# Patient Record
Sex: Female | Born: 1950 | Race: White | Hispanic: No | Marital: Married | Smoking: Former smoker
Health system: Southern US, Community
[De-identification: ages and names within clinical notes are randomized; demographics above are authoritative.]

## PROBLEM LIST (undated history)

## (undated) DIAGNOSIS — T8859XA Other complications of anesthesia, initial encounter: Secondary | ICD-10-CM

## (undated) DIAGNOSIS — J449 Chronic obstructive pulmonary disease, unspecified: Secondary | ICD-10-CM

## (undated) DIAGNOSIS — J189 Pneumonia, unspecified organism: Secondary | ICD-10-CM

## (undated) DIAGNOSIS — R0600 Dyspnea, unspecified: Secondary | ICD-10-CM

## (undated) HISTORY — PX: DILATION AND CURETTAGE OF UTERUS: SHX78

## (undated) HISTORY — DX: Chronic obstructive pulmonary disease, unspecified: J44.9

## (undated) HISTORY — PX: EYE SURGERY: SHX253

## (undated) HISTORY — PX: APPENDECTOMY: SHX54

## (undated) HISTORY — PX: ABDOMINAL HYSTERECTOMY: SHX81

## (undated) HISTORY — PX: CHOLECYSTECTOMY: SHX55

## (undated) HISTORY — PX: TONSILLECTOMY: SUR1361

## (undated) HISTORY — PX: MENISCUS REPAIR: SHX5179

---

## 2016-09-01 DIAGNOSIS — M549 Dorsalgia, unspecified: Secondary | ICD-10-CM | POA: Diagnosis not present

## 2016-09-01 DIAGNOSIS — M5410 Radiculopathy, site unspecified: Secondary | ICD-10-CM | POA: Diagnosis not present

## 2016-12-04 DIAGNOSIS — M7671 Peroneal tendinitis, right leg: Secondary | ICD-10-CM | POA: Diagnosis not present

## 2016-12-25 DIAGNOSIS — M7671 Peroneal tendinitis, right leg: Secondary | ICD-10-CM | POA: Diagnosis not present

## 2017-01-09 DIAGNOSIS — M7671 Peroneal tendinitis, right leg: Secondary | ICD-10-CM | POA: Diagnosis not present

## 2017-02-08 DIAGNOSIS — M7671 Peroneal tendinitis, right leg: Secondary | ICD-10-CM | POA: Diagnosis not present

## 2017-02-08 DIAGNOSIS — M7661 Achilles tendinitis, right leg: Secondary | ICD-10-CM | POA: Diagnosis not present

## 2017-02-20 DIAGNOSIS — M79671 Pain in right foot: Secondary | ICD-10-CM | POA: Diagnosis not present

## 2017-02-20 DIAGNOSIS — M25571 Pain in right ankle and joints of right foot: Secondary | ICD-10-CM | POA: Diagnosis not present

## 2017-02-20 DIAGNOSIS — M7661 Achilles tendinitis, right leg: Secondary | ICD-10-CM | POA: Diagnosis not present

## 2017-03-01 DIAGNOSIS — M7671 Peroneal tendinitis, right leg: Secondary | ICD-10-CM | POA: Diagnosis not present

## 2017-05-10 DIAGNOSIS — M7671 Peroneal tendinitis, right leg: Secondary | ICD-10-CM | POA: Diagnosis not present

## 2017-05-10 DIAGNOSIS — H25813 Combined forms of age-related cataract, bilateral: Secondary | ICD-10-CM | POA: Diagnosis not present

## 2017-06-05 DIAGNOSIS — Z01818 Encounter for other preprocedural examination: Secondary | ICD-10-CM | POA: Diagnosis not present

## 2017-06-05 DIAGNOSIS — H25811 Combined forms of age-related cataract, right eye: Secondary | ICD-10-CM | POA: Diagnosis not present

## 2017-06-05 DIAGNOSIS — H2511 Age-related nuclear cataract, right eye: Secondary | ICD-10-CM | POA: Diagnosis not present

## 2017-06-05 DIAGNOSIS — H25813 Combined forms of age-related cataract, bilateral: Secondary | ICD-10-CM | POA: Diagnosis not present

## 2017-07-09 DIAGNOSIS — H2512 Age-related nuclear cataract, left eye: Secondary | ICD-10-CM | POA: Diagnosis not present

## 2017-07-09 DIAGNOSIS — H25812 Combined forms of age-related cataract, left eye: Secondary | ICD-10-CM | POA: Diagnosis not present

## 2017-08-02 DIAGNOSIS — Z961 Presence of intraocular lens: Secondary | ICD-10-CM | POA: Diagnosis not present

## 2017-09-07 DIAGNOSIS — J111 Influenza due to unidentified influenza virus with other respiratory manifestations: Secondary | ICD-10-CM | POA: Diagnosis not present

## 2017-09-07 DIAGNOSIS — J0140 Acute pansinusitis, unspecified: Secondary | ICD-10-CM | POA: Diagnosis not present

## 2017-10-07 DIAGNOSIS — R1084 Generalized abdominal pain: Secondary | ICD-10-CM | POA: Diagnosis not present

## 2017-10-07 DIAGNOSIS — K5900 Constipation, unspecified: Secondary | ICD-10-CM | POA: Diagnosis not present

## 2017-10-29 DIAGNOSIS — K5900 Constipation, unspecified: Secondary | ICD-10-CM | POA: Diagnosis not present

## 2018-03-06 DIAGNOSIS — G894 Chronic pain syndrome: Secondary | ICD-10-CM | POA: Diagnosis not present

## 2018-03-06 DIAGNOSIS — M5126 Other intervertebral disc displacement, lumbar region: Secondary | ICD-10-CM | POA: Diagnosis not present

## 2018-04-30 DIAGNOSIS — R030 Elevated blood-pressure reading, without diagnosis of hypertension: Secondary | ICD-10-CM | POA: Diagnosis not present

## 2018-04-30 DIAGNOSIS — M461 Sacroiliitis, not elsewhere classified: Secondary | ICD-10-CM | POA: Diagnosis not present

## 2018-04-30 DIAGNOSIS — M48061 Spinal stenosis, lumbar region without neurogenic claudication: Secondary | ICD-10-CM | POA: Diagnosis not present

## 2018-04-30 DIAGNOSIS — Z6835 Body mass index (BMI) 35.0-35.9, adult: Secondary | ICD-10-CM | POA: Diagnosis not present

## 2018-04-30 DIAGNOSIS — M47896 Other spondylosis, lumbar region: Secondary | ICD-10-CM | POA: Diagnosis not present

## 2018-05-10 DIAGNOSIS — M47818 Spondylosis without myelopathy or radiculopathy, sacral and sacrococcygeal region: Secondary | ICD-10-CM | POA: Diagnosis not present

## 2018-05-28 DIAGNOSIS — Z23 Encounter for immunization: Secondary | ICD-10-CM | POA: Diagnosis not present

## 2018-05-28 DIAGNOSIS — G894 Chronic pain syndrome: Secondary | ICD-10-CM | POA: Diagnosis not present

## 2018-05-28 DIAGNOSIS — M8580 Other specified disorders of bone density and structure, unspecified site: Secondary | ICD-10-CM | POA: Diagnosis not present

## 2018-05-28 DIAGNOSIS — Z1389 Encounter for screening for other disorder: Secondary | ICD-10-CM | POA: Diagnosis not present

## 2018-05-28 DIAGNOSIS — Z87891 Personal history of nicotine dependence: Secondary | ICD-10-CM | POA: Diagnosis not present

## 2018-05-28 DIAGNOSIS — I10 Essential (primary) hypertension: Secondary | ICD-10-CM | POA: Diagnosis not present

## 2018-05-28 DIAGNOSIS — Z0000 Encounter for general adult medical examination without abnormal findings: Secondary | ICD-10-CM | POA: Diagnosis not present

## 2018-06-10 DIAGNOSIS — E0590 Thyrotoxicosis, unspecified without thyrotoxic crisis or storm: Secondary | ICD-10-CM | POA: Diagnosis not present

## 2018-07-08 DIAGNOSIS — M48061 Spinal stenosis, lumbar region without neurogenic claudication: Secondary | ICD-10-CM | POA: Diagnosis not present

## 2018-07-08 DIAGNOSIS — M461 Sacroiliitis, not elsewhere classified: Secondary | ICD-10-CM | POA: Diagnosis not present

## 2018-07-08 DIAGNOSIS — M47818 Spondylosis without myelopathy or radiculopathy, sacral and sacrococcygeal region: Secondary | ICD-10-CM | POA: Diagnosis not present

## 2018-07-08 DIAGNOSIS — M47896 Other spondylosis, lumbar region: Secondary | ICD-10-CM | POA: Diagnosis not present

## 2018-07-16 DIAGNOSIS — M47816 Spondylosis without myelopathy or radiculopathy, lumbar region: Secondary | ICD-10-CM | POA: Diagnosis not present

## 2018-08-06 DIAGNOSIS — M47816 Spondylosis without myelopathy or radiculopathy, lumbar region: Secondary | ICD-10-CM | POA: Diagnosis not present

## 2018-08-06 DIAGNOSIS — R030 Elevated blood-pressure reading, without diagnosis of hypertension: Secondary | ICD-10-CM | POA: Diagnosis not present

## 2018-08-09 DIAGNOSIS — E039 Hypothyroidism, unspecified: Secondary | ICD-10-CM | POA: Diagnosis not present

## 2018-09-03 DIAGNOSIS — M461 Sacroiliitis, not elsewhere classified: Secondary | ICD-10-CM | POA: Diagnosis not present

## 2018-09-03 DIAGNOSIS — R030 Elevated blood-pressure reading, without diagnosis of hypertension: Secondary | ICD-10-CM | POA: Diagnosis not present

## 2018-09-10 DIAGNOSIS — M47818 Spondylosis without myelopathy or radiculopathy, sacral and sacrococcygeal region: Secondary | ICD-10-CM | POA: Diagnosis not present

## 2018-09-10 DIAGNOSIS — R030 Elevated blood-pressure reading, without diagnosis of hypertension: Secondary | ICD-10-CM | POA: Diagnosis not present

## 2018-09-25 DIAGNOSIS — E039 Hypothyroidism, unspecified: Secondary | ICD-10-CM | POA: Diagnosis not present

## 2018-12-26 DIAGNOSIS — E039 Hypothyroidism, unspecified: Secondary | ICD-10-CM | POA: Diagnosis not present

## 2019-05-30 DIAGNOSIS — M47816 Spondylosis without myelopathy or radiculopathy, lumbar region: Secondary | ICD-10-CM | POA: Diagnosis not present

## 2019-05-30 DIAGNOSIS — M461 Sacroiliitis, not elsewhere classified: Secondary | ICD-10-CM | POA: Diagnosis not present

## 2019-05-30 DIAGNOSIS — M47818 Spondylosis without myelopathy or radiculopathy, sacral and sacrococcygeal region: Secondary | ICD-10-CM | POA: Diagnosis not present

## 2019-05-30 DIAGNOSIS — M48061 Spinal stenosis, lumbar region without neurogenic claudication: Secondary | ICD-10-CM | POA: Diagnosis not present

## 2019-06-04 DIAGNOSIS — M48061 Spinal stenosis, lumbar region without neurogenic claudication: Secondary | ICD-10-CM | POA: Diagnosis not present

## 2019-06-04 DIAGNOSIS — R030 Elevated blood-pressure reading, without diagnosis of hypertension: Secondary | ICD-10-CM | POA: Diagnosis not present

## 2019-06-13 DIAGNOSIS — R030 Elevated blood-pressure reading, without diagnosis of hypertension: Secondary | ICD-10-CM | POA: Diagnosis not present

## 2019-06-13 DIAGNOSIS — M461 Sacroiliitis, not elsewhere classified: Secondary | ICD-10-CM | POA: Diagnosis not present

## 2019-07-02 DIAGNOSIS — M47816 Spondylosis without myelopathy or radiculopathy, lumbar region: Secondary | ICD-10-CM | POA: Diagnosis not present

## 2019-07-02 DIAGNOSIS — R030 Elevated blood-pressure reading, without diagnosis of hypertension: Secondary | ICD-10-CM | POA: Diagnosis not present

## 2019-07-09 DIAGNOSIS — M47816 Spondylosis without myelopathy or radiculopathy, lumbar region: Secondary | ICD-10-CM | POA: Diagnosis not present

## 2019-07-09 DIAGNOSIS — R030 Elevated blood-pressure reading, without diagnosis of hypertension: Secondary | ICD-10-CM | POA: Diagnosis not present

## 2019-08-05 DIAGNOSIS — M47818 Spondylosis without myelopathy or radiculopathy, sacral and sacrococcygeal region: Secondary | ICD-10-CM | POA: Diagnosis not present

## 2019-08-11 DIAGNOSIS — M48061 Spinal stenosis, lumbar region without neurogenic claudication: Secondary | ICD-10-CM | POA: Diagnosis not present

## 2019-08-20 DIAGNOSIS — M5127 Other intervertebral disc displacement, lumbosacral region: Secondary | ICD-10-CM | POA: Diagnosis not present

## 2019-08-20 DIAGNOSIS — M48061 Spinal stenosis, lumbar region without neurogenic claudication: Secondary | ICD-10-CM | POA: Diagnosis not present

## 2019-09-22 DIAGNOSIS — S82831D Other fracture of upper and lower end of right fibula, subsequent encounter for closed fracture with routine healing: Secondary | ICD-10-CM | POA: Diagnosis not present

## 2019-09-29 DIAGNOSIS — S82831D Other fracture of upper and lower end of right fibula, subsequent encounter for closed fracture with routine healing: Secondary | ICD-10-CM | POA: Diagnosis not present

## 2019-10-08 DIAGNOSIS — S82831D Other fracture of upper and lower end of right fibula, subsequent encounter for closed fracture with routine healing: Secondary | ICD-10-CM | POA: Diagnosis not present

## 2019-10-27 DIAGNOSIS — S82831D Other fracture of upper and lower end of right fibula, subsequent encounter for closed fracture with routine healing: Secondary | ICD-10-CM | POA: Diagnosis not present

## 2019-11-19 DIAGNOSIS — M25671 Stiffness of right ankle, not elsewhere classified: Secondary | ICD-10-CM | POA: Diagnosis not present

## 2019-11-19 DIAGNOSIS — M25571 Pain in right ankle and joints of right foot: Secondary | ICD-10-CM | POA: Diagnosis not present

## 2019-11-19 DIAGNOSIS — R2689 Other abnormalities of gait and mobility: Secondary | ICD-10-CM | POA: Diagnosis not present

## 2019-11-24 DIAGNOSIS — S82831D Other fracture of upper and lower end of right fibula, subsequent encounter for closed fracture with routine healing: Secondary | ICD-10-CM | POA: Diagnosis not present

## 2020-03-09 DIAGNOSIS — M48061 Spinal stenosis, lumbar region without neurogenic claudication: Secondary | ICD-10-CM | POA: Diagnosis not present

## 2020-04-13 DIAGNOSIS — M48061 Spinal stenosis, lumbar region without neurogenic claudication: Secondary | ICD-10-CM | POA: Diagnosis not present

## 2020-09-03 DIAGNOSIS — J209 Acute bronchitis, unspecified: Secondary | ICD-10-CM | POA: Diagnosis not present

## 2020-09-03 DIAGNOSIS — R030 Elevated blood-pressure reading, without diagnosis of hypertension: Secondary | ICD-10-CM | POA: Diagnosis not present

## 2020-09-14 DIAGNOSIS — E039 Hypothyroidism, unspecified: Secondary | ICD-10-CM | POA: Diagnosis not present

## 2020-09-14 DIAGNOSIS — J40 Bronchitis, not specified as acute or chronic: Secondary | ICD-10-CM | POA: Diagnosis not present

## 2020-09-16 DIAGNOSIS — M545 Low back pain, unspecified: Secondary | ICD-10-CM | POA: Diagnosis not present

## 2020-09-16 DIAGNOSIS — M533 Sacrococcygeal disorders, not elsewhere classified: Secondary | ICD-10-CM | POA: Diagnosis not present

## 2020-10-28 DIAGNOSIS — M545 Low back pain, unspecified: Secondary | ICD-10-CM | POA: Diagnosis not present

## 2020-11-01 DIAGNOSIS — E039 Hypothyroidism, unspecified: Secondary | ICD-10-CM | POA: Diagnosis not present

## 2020-11-29 DIAGNOSIS — M47817 Spondylosis without myelopathy or radiculopathy, lumbosacral region: Secondary | ICD-10-CM | POA: Diagnosis not present

## 2020-11-29 DIAGNOSIS — M545 Low back pain, unspecified: Secondary | ICD-10-CM | POA: Diagnosis not present

## 2020-11-29 DIAGNOSIS — M48061 Spinal stenosis, lumbar region without neurogenic claudication: Secondary | ICD-10-CM | POA: Diagnosis not present

## 2020-11-29 DIAGNOSIS — M5137 Other intervertebral disc degeneration, lumbosacral region: Secondary | ICD-10-CM | POA: Diagnosis not present

## 2020-12-24 DIAGNOSIS — M5441 Lumbago with sciatica, right side: Secondary | ICD-10-CM | POA: Diagnosis not present

## 2020-12-24 DIAGNOSIS — M5416 Radiculopathy, lumbar region: Secondary | ICD-10-CM | POA: Diagnosis not present

## 2021-01-31 DIAGNOSIS — M5459 Other low back pain: Secondary | ICD-10-CM | POA: Diagnosis not present

## 2021-02-02 DIAGNOSIS — M545 Low back pain, unspecified: Secondary | ICD-10-CM | POA: Diagnosis not present

## 2021-02-08 DIAGNOSIS — M545 Low back pain, unspecified: Secondary | ICD-10-CM | POA: Diagnosis not present

## 2021-02-10 DIAGNOSIS — M545 Low back pain, unspecified: Secondary | ICD-10-CM | POA: Diagnosis not present

## 2021-02-15 DIAGNOSIS — M545 Low back pain, unspecified: Secondary | ICD-10-CM | POA: Diagnosis not present

## 2021-02-22 DIAGNOSIS — M545 Low back pain, unspecified: Secondary | ICD-10-CM | POA: Diagnosis not present

## 2021-02-24 DIAGNOSIS — M545 Low back pain, unspecified: Secondary | ICD-10-CM | POA: Diagnosis not present

## 2021-02-24 DIAGNOSIS — M5459 Other low back pain: Secondary | ICD-10-CM | POA: Diagnosis not present

## 2021-02-28 DIAGNOSIS — M545 Low back pain, unspecified: Secondary | ICD-10-CM | POA: Diagnosis not present

## 2021-03-01 DIAGNOSIS — M545 Low back pain, unspecified: Secondary | ICD-10-CM | POA: Diagnosis not present

## 2021-03-08 DIAGNOSIS — M48062 Spinal stenosis, lumbar region with neurogenic claudication: Secondary | ICD-10-CM | POA: Diagnosis not present

## 2021-03-16 ENCOUNTER — Ambulatory Visit: Payer: Self-pay | Admitting: Orthopedic Surgery

## 2021-03-30 DIAGNOSIS — J069 Acute upper respiratory infection, unspecified: Secondary | ICD-10-CM | POA: Diagnosis not present

## 2021-03-31 DIAGNOSIS — R110 Nausea: Secondary | ICD-10-CM | POA: Diagnosis not present

## 2021-03-31 DIAGNOSIS — Z5321 Procedure and treatment not carried out due to patient leaving prior to being seen by health care provider: Secondary | ICD-10-CM | POA: Diagnosis not present

## 2021-03-31 DIAGNOSIS — R509 Fever, unspecified: Secondary | ICD-10-CM | POA: Diagnosis not present

## 2021-04-01 DIAGNOSIS — R52 Pain, unspecified: Secondary | ICD-10-CM | POA: Diagnosis not present

## 2021-04-01 DIAGNOSIS — Z743 Need for continuous supervision: Secondary | ICD-10-CM | POA: Diagnosis not present

## 2021-04-01 DIAGNOSIS — R0902 Hypoxemia: Secondary | ICD-10-CM | POA: Diagnosis not present

## 2021-04-01 DIAGNOSIS — R42 Dizziness and giddiness: Secondary | ICD-10-CM | POA: Diagnosis not present

## 2021-04-01 DIAGNOSIS — S0990XA Unspecified injury of head, initial encounter: Secondary | ICD-10-CM | POA: Diagnosis not present

## 2021-04-01 DIAGNOSIS — R059 Cough, unspecified: Secondary | ICD-10-CM | POA: Diagnosis not present

## 2021-04-01 DIAGNOSIS — M542 Cervicalgia: Secondary | ICD-10-CM | POA: Diagnosis not present

## 2021-04-01 DIAGNOSIS — G4489 Other headache syndrome: Secondary | ICD-10-CM | POA: Diagnosis not present

## 2021-04-01 DIAGNOSIS — R519 Headache, unspecified: Secondary | ICD-10-CM | POA: Diagnosis not present

## 2021-04-01 DIAGNOSIS — G44311 Acute post-traumatic headache, intractable: Secondary | ICD-10-CM | POA: Diagnosis not present

## 2021-04-04 DIAGNOSIS — G43019 Migraine without aura, intractable, without status migrainosus: Secondary | ICD-10-CM | POA: Diagnosis not present

## 2021-04-05 ENCOUNTER — Ambulatory Visit: Payer: Self-pay | Admitting: Orthopedic Surgery

## 2021-04-08 NOTE — Pre-Procedure Instructions (Signed)
Surgical Instructions    Your procedure is scheduled on Wednesday, August 31st, 2022.  Report to [facility] Main Entrance "A" at 11:00 A.M., then check in with the Admitting office.  Call this number if you have problems the morning of surgery:  [phone]   If you have any questions prior to your surgery date call [phone]: Open Monday-Friday 8am-4pm    Remember:  Do not eat after midnight the night before your surgery  You may drink clear liquids until 10:00 A.M. the morning of your surgery.   Clear liquids allowed are: Water, Non-Citrus Juices (without pulp), Carbonated Beverages, Clear Tea, Black Coffee Only, and Gatorade    Take these medicines the morning of surgery with A SIP OF WATER: None    As of today, STOP taking any Aspirin (unless otherwise instructed by your surgeon) Aleve, Naproxen, Ibuprofen, Motrin, Advil, Goody's, BC's, all herbal medications, fish oil, and all vitamins.          Do not wear jewelry or makeup Do not wear lotions, powders, perfumes, or deodorant. Do not shave 48 hours prior to surgery.  Do not bring valuables to the hospital. DO Not wear nail polish, gel polish, artificial nails, or any other type of covering on  natural nails including finger and toenails. If patients have artificial nails, gel coating, etc. that need to be removed by a nail salon please have this removed prior to surgery or surgery may need to be canceled/delayed if the surgeon/ anesthesia feels like the patient is unable to be adequately monitored.             [facility] is not responsible for any belongings or valuables.  Do NOT Smoke (Tobacco/Vaping) or drink Alcohol 24 hours prior to your procedure If you use a CPAP at night, you may bring all equipment for your overnight stay.   Contacts, glasses, dentures or bridgework may not be worn into surgery, please bring cases for these belongings   For patients admitted to the hospital, discharge time will be determined by  your treatment team.   Patients discharged the day of surgery will not be allowed to drive home, and someone needs to stay with them for 24 hours.  ONLY 1 SUPPORT PERSON MAY BE PRESENT WHILE YOU ARE IN SURGERY. IF YOU ARE TO BE ADMITTED ONCE YOU ARE IN YOUR ROOM YOU WILL BE ALLOWED TWO (2) VISITORS.  Minor children may have two parents present. Special consideration for safety and communication needs will be reviewed on a case by case basis.  Special instructions:    Oral Hygiene is also important to reduce your risk of infection.  Remember - BRUSH YOUR TEETH THE MORNING OF SURGERY WITH YOUR REGULAR TOOTHPASTE   [facility]- Preparing For Surgery  Before surgery, you can play an important role. Because skin is not sterile, your skin needs to be as free of germs as possible. You can reduce the number of germs on your skin by washing with CHG (chlorahexidine gluconate) Soap before surgery.  CHG is an antiseptic cleaner which kills germs and bonds with the skin to continue killing germs even after washing.     Please do not use if you have an allergy to CHG or antibacterial soaps. If your skin becomes reddened/irritated stop using the CHG.  Do not shave (including legs and underarms) for at least 48 hours prior to first CHG shower. It is OK to shave your face.  Please follow these instructions carefully.  Shower the NIGHT BEFORE SURGERY and the MORNING OF SURGERY with CHG Soap.   If you chose to wash your hair, wash your hair first as usual with your normal shampoo. After you shampoo, rinse your hair and body thoroughly to remove the shampoo.  Then [employer] and genitals (private parts) with your normal soap and rinse thoroughly to remove soap.  After that Use CHG Soap as you would any other liquid soap. You can apply CHG directly to the skin and wash gently with a scrungie or a clean washcloth.   Apply the CHG Soap to your body ONLY FROM THE NECK DOWN.  Do not use on open wounds or open  sores. Avoid contact with your eyes, ears, mouth and genitals (private parts). Wash Face and genitals (private parts)  with your normal soap.   Wash thoroughly, paying special attention to the area where your surgery will be performed.  Thoroughly rinse your body with warm water from the neck down.  DO NOT shower/wash with your normal soap after using and rinsing off the CHG Soap.  Pat yourself dry with a CLEAN TOWEL.  Wear CLEAN PAJAMAS to bed the night before surgery  Place CLEAN SHEETS on your bed the night before your surgery  DO NOT SLEEP WITH PETS.   Day of Surgery:  Take a shower with CHG soap. Wear Clean/Comfortable clothing the morning of surgery Do not apply any deodorants/lotions.   Remember to brush your teeth WITH YOUR REGULAR TOOTHPASTE.   Please read over the following fact sheets that you were given.

## 2021-04-11 ENCOUNTER — Ambulatory Visit: Payer: Self-pay | Admitting: Orthopedic Surgery

## 2021-04-11 ENCOUNTER — Encounter (HOSPITAL_COMMUNITY)
Admission: RE | Admit: 2021-04-11 | Discharge: 2021-04-11 | Disposition: A | Discharge status: Home / Self Care | Payer: Medicare Other | Source: Ambulatory Visit | Attending: Orthopedic Surgery | Admitting: Orthopedic Surgery

## 2021-04-11 ENCOUNTER — Encounter (HOSPITAL_COMMUNITY): Payer: Self-pay | Admitting: Physician Assistant

## 2021-04-11 ENCOUNTER — Encounter: Payer: Self-pay | Admitting: Orthopedic Surgery

## 2021-04-11 ENCOUNTER — Encounter (HOSPITAL_COMMUNITY): Payer: Self-pay

## 2021-04-11 ENCOUNTER — Other Ambulatory Visit: Payer: Self-pay

## 2021-04-11 DIAGNOSIS — M5136 Other intervertebral disc degeneration, lumbar region: Secondary | ICD-10-CM | POA: Diagnosis not present

## 2021-04-11 DIAGNOSIS — Z87891 Personal history of nicotine dependence: Secondary | ICD-10-CM | POA: Insufficient documentation

## 2021-04-11 DIAGNOSIS — Z01812 Encounter for preprocedural laboratory examination: Secondary | ICD-10-CM | POA: Insufficient documentation

## 2021-04-11 DIAGNOSIS — M48062 Spinal stenosis, lumbar region with neurogenic claudication: Secondary | ICD-10-CM | POA: Diagnosis not present

## 2021-04-11 DIAGNOSIS — M545 Low back pain, unspecified: Secondary | ICD-10-CM | POA: Diagnosis not present

## 2021-04-11 DIAGNOSIS — Z01818 Encounter for other preprocedural examination: Secondary | ICD-10-CM | POA: Diagnosis not present

## 2021-04-11 HISTORY — DX: Other complications of anesthesia, initial encounter: T88.59XA

## 2021-04-11 HISTORY — DX: Dyspnea, unspecified: R06.00

## 2021-04-11 HISTORY — DX: Pneumonia, unspecified organism: J18.9

## 2021-04-11 LAB — URINALYSIS, ROUTINE W REFLEX MICROSCOPIC
Bilirubin Urine: NEGATIVE
Glucose, UA: NEGATIVE mg/dL
Hgb urine dipstick: NEGATIVE
Ketones, ur: NEGATIVE mg/dL
Leukocytes,Ua: NEGATIVE
Nitrite: NEGATIVE
Protein, ur: NEGATIVE mg/dL
Specific Gravity, Urine: 1.01 (ref 1.005–1.030)
pH: 7 (ref 5.0–8.0)

## 2021-04-11 LAB — BASIC METABOLIC PANEL
Anion gap: 11 (ref 5–15)
BUN: <5 mg/dL — ABNORMAL LOW (ref 8–23)
CO2: 26 mmol/L (ref 22–32)
Calcium: 9.2 mg/dL (ref 8.9–10.3)
Chloride: 102 mmol/L (ref 98–111)
Creatinine, Ser: 0.72 mg/dL (ref 0.44–1.00)
GFR, Estimated: >60 mL/min (ref >60)
Glucose, Bld: 86 mg/dL (ref 70–99)
Potassium: 3.8 mmol/L (ref 3.5–5.1)
Sodium: 139 mmol/L (ref 135–145)

## 2021-04-11 LAB — CBC
HCT: 41.2 % (ref 36.0–46.0)
Hemoglobin: 13.5 g/dL (ref 12.0–15.0)
MCH: 32.5 pg (ref 26.0–34.0)
MCHC: 32.8 g/dL (ref 30.0–36.0)
MCV: 99.3 fL (ref 80.0–100.0)
Platelets: 399 10*3/uL (ref 150–400)
RBC: 4.15 MIL/uL (ref 3.87–5.11)
RDW: 13 % (ref 11.5–15.5)
WBC: 10 10*3/uL (ref 4.0–10.5)
nRBC: 0 % (ref 0.0–0.2)

## 2021-04-11 LAB — SURGICAL PCR SCREEN
MRSA, PCR: NEGATIVE
Staphylococcus aureus: NEGATIVE

## 2021-04-11 LAB — TYPE AND SCREEN
ABO/RH(D): O POS
Antibody Screen: NEGATIVE

## 2021-04-11 LAB — PROTIME-INR
INR: 0.9 (ref 0.8–1.2)
Prothrombin Time: 12.6 seconds (ref 11.4–15.2)

## 2021-04-11 LAB — APTT: aPTT: 21 seconds — ABNORMAL LOW (ref 24–36)

## 2021-04-11 NOTE — Progress Notes (Addendum)
Patient in PAT was concerned about the time of surgery. Per order, patient is scheduled to have surgery on 04/20/2021 between 12:58 and 17:28. Patient verbalized that this AM MD told her that she is his first case (07:30 o'clock). This Clinical research associate called Voncille Lo PA-C and asked for clarification. PA-C said that she will contact MD and she will let the patient know if the schedule was changed.   BP elevated in PAT 170/92 when patient arrived and 151/80 after the appointment. Patient verbalized that she is not hypertensive and she is not taking any medication for high BP. Patient verbalized that she is feeling well, no symptoms. PA-C in PAT notified and Voncille Lo, PA-C in Dr. Shon Baton office notified.

## 2021-04-11 NOTE — Progress Notes (Addendum)
PCP - [name], MD Cardiologist - denies  PPM/ICD - denies Device Orders - N/A Rep Notified - N/A  Chest x-ray - N/A EKG - N/A Stress Test - denies ECHO - denies Cardiac Cath - denies  Sleep Study - denies CPAP - N/A  Fasting Blood Sugar - N/A  Blood Thinner Instructions: N/A  Aspirin Instructions: Patient was instructed: As of today, STOP taking any Aspirin (unless otherwise instructed by your surgeon) Aleve, Naproxen, Ibuprofen, Motrin, Advil, Goody's, BC's, all herbal medications, fish oil, and all vitamins.  ERAS Protcol - yes PRE-SURGERY Ensure or G2- no  COVID TEST- patient was instructed to go to the COVID testing site on 04/18/2021  Anesthesia review: yes. Difficulty waking up after anesthesia, per patient. BP elevated in PAT 170/92 when patient arrived and 151/80 at the end of appointment. Patient with no hx of hypertension. No symptoms in PAT;   Patient denies shortness of breath, fever, cough and chest pain at PAT appointment   All instructions explained to the patient, with a verbal understanding of the material. Patient agrees to go over the instructions while at home for a better understanding. Patient also instructed to self quarantine after being tested for COVID-19. The opportunity to ask questions was provided.

## 2021-04-11 NOTE — H&P (Signed)
Subjective:   Allergies Reviewed Allergies NKDA Medications Reviewed Medications albuterol sulfate 2.5 mg/3 mL (0.083 %) solution for nebulization 04/07/21   filled surescripts azithromycin 250 mg tablet TK 2 TS PO ON DAY 1, THEN TK 1 T PO D FOR 4 DAYS 04/01/21   filled surescripts codeine 10 mg-guaifenesin 100 mg/5 mL oral liquid TAKE 10 MILILITERS BY MOUTH EVERY 6 HOURS AS NEEDED 09/14/20   filled surescripts diclofenac potassium 50 mg tablet TAKE 1 TABLET BY MOUTH TWICE DAILY 02/05/20   filled surescripts gabapentin 300 mg capsule TAKE 1 CAPSULE BY MOUTH AT BEDTIME 12/24/20   filled surescripts levothyroxine 100 mcg tablet TAKE 1 TABLET BY MOUTH DAILY 09/16/20   filled surescripts meloxicam 15 mg tablet TAKE 1 TABLET BY MOUTH DAILY WITH A MEAL 10/28/20   filled surescripts methylPREDNISolone 4 mg tablets in a dose pack FOLLOW PACKAGE DIRECTIONS 11/05/20   filled surescripts ondansetron 4 mg disintegrating tablet DISSOLVE 1 TABLET ON THE TONGUE EVERY 6 HOURS AS NEEDED FOR NAUSEA OR VOMITING 04/01/21   filled surescripts Ubrelvy 100 mg tablet 04/05/21   filled surescripts Problems Reviewed Problems Pain in lumbar spine - Onset: 01/31/2021 Family History Reviewed Family History Social History Reviewed Social History Surgical History Reviewed Surgical History Past Medical History Reviewed Past Medical History  HPI  Here for preop H&P. Recent fall in bathtub, CT scan demonstrated pneumonia. Treated with 5 day course of antibiotics, ended on Thursday. She does feels symptoms have improved. She has seen her primary care provider who does not think this recent episode would require surgery to be postponed. XLIF L3-L4 with PSFI CONE 04/20/21   Review of Systems Pertinent items are noted in HPI.  Objective:    Ht: 5 ft 1 in 04/11/2021 10:45 am Wt: 191.5 lbs 04/11/2021 10:48 am BMI: 36.2 04/11/2021 10:48 am BP: 140/80 04/11/2021 10:47 am Pulse: 95 bpm  04/11/2021 10:47 am  General: Alert and oriented 3, no apparent distress.  Heart: Regular rate and rhythm no rubs, murmurs, or gallops  Lungs: Bilateral crackles at the base of the lungs. Recent pneumonia diagnosis, treated with antibiotics. Able speak in complete sentences. No shortness of breath  Abdomen: Bowel sounds 4, nondistended, nontender, no rebound tenderness  Stacey Poole continues to have significant back buttock and neuropathic leg pain bilaterally. Back pain is quite severe and represents approximately 80% of her overall pain. Difficulty with forward flexion and extension due to pain. 5/5 motor strength in the lower extremities bilaterally. Positive dysesthesias consistent with claudication primarily in the thigh  Lumbar MRI: completed on 02/24/21 was reviewed with the patient. It was completed at [facility] imaging; I have independently reviewed the images as well as the radiology report. No significant canal or foraminal stenosis L1-3. Slight progression of degenerative disc disease at L3-4 with severe canal and lateral recess stenosis. Mild central stenosis at L4-5, no significant foraminal stenosis. No significant canal stenosis at L5-S1. There is also reported a questionable cyst in the kidney. I have given her a copy of the report to take to her primary care physician to review to determine if further workup is necessary.  Assessment:   Stacey Poole is a very pleasant 70 year old man has had chronic debilitating back pain with episodic neuropathic leg pain. 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 We have had a long discussion about treatment options. She has had multiple injections in the past, and physical therapy and despite  this her pain is severe and debilitating and she has expressed a desire to move forward with surgery.   Plan:    At this point time I do believe the spinal stenosis and  degenerative disc disease of her primary sources of pain. We briefly discussed surgical options. If we move forward with a straightforward decompression this would help alleviate the neurogenic claudication pain I decompressing the nerves. However, I do not think it will significantly improve her back pain which is a major component of her disability. The other option is to perform a lateral interbody fusion which would remove the painful disc restore the intervertebral disc height and indirectly decompress the spinal stenosis. This would address the back and neuropathic leg pain.  I discussed the surgical procedure in great detail with the patient and her mother and all their questions were encouraged and addressed. She will think about her options and contact me and let me know how she would like to proceed.OLIF/XLIF risks, benefits of surgery were reviewed with the patient. These include: infection, bleeding, death, stroke, paralysis, ongoing or worse pain, need for additional surgery, injury to the lumbar plexus resulting in hip flexor weakness and difficulty walking without assistive devices. Adjacent segment degenerative disease, need for additional surgery including fusing other levels, leak of spinal fluid, Nonunion, hardware failure, breakage, or mal-position. Deep venous thrombosis (DVT) requiring additional treatment such as filter, and/or medications. Injury to abdominal contents, loss in bowel and bladder control.  We have previously obtained preoperative medical clearance from the patient's primary care provider. Unfortunately, since we obtained clearance the patient did have a fall in a bathtub, she had a CT scan performed due to this fall but also demonstrated pneumonia. She was treated with 5 day course of antibiotics which she finished last Thursday. She does feel that her symptoms have improved. She did have a follow-up with her primary care provider who does not feel that the pneumonia or the  concussion that she suffered from the fall would be a reason that she should not move forward with surgery. We will defer additional clearance in regard to the pneumonia to anesthesia at [facility].  We have also discussed the post-operative recovery period to include: bathing/showering restrictions, wound healing, activity (and driving) restrictions, medications/pain mangement.  We have also discussed post-operative redflags to include: signs and symptoms of postoperative infection, DVT/PE.  Follow-up: 2 weeks postop

## 2021-04-12 NOTE — Progress Notes (Addendum)
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  History includes former smoker, dyspnea, PNA. Reportedly "difficulty waking up after anesthesia."  PAT RN received a call for [place], [place], [facility] asking if anesthesia would require preoperative CXR based on history of recent PNA. Currently, no records regarding her pneumonia are available, but reportedly patient has already had follow-up with her PCP and felt clinically improved and cleared for surgery. Given this information, I do not think she would require a preoperative CXR. That said, timing of PNA could influence timing of surgery, so I will review primary care records/clearances once available. Preliminary H&P note suggests pneumonia was an incidental finding on CT after patient fell in the tub. She has since completed a 5 day course of antibiotics, her symptoms improved, and she had re-evaluation by her PCP "who does not feel that the pneumonia or the concussion that she suffered from the fall would be a reason that she should not move forward with surgery."  Will follow up records.   ADDENDUM 04/13/21 3:46 PM: Received records from [facility] ED and [facility]. She had an ED visit on 04/01/21 for headache (referred by PCP). She had a fever of 102 4 days prior (03/28/21) with decreased appetite. Some coughing. She attempted to take a bath to lower her fever and slipped in the bathtub hitting her head. She had a severe headache since with occasional N/V with emesis x4. She denied LOC.  She reportedly had two negative COVID-19 tests at her PCP office. Toradol gave partial headache relief. T 99.6, O2 sat 92%.Lung sounds documented as clear. Labs showed WBC 11.2, H/H 15.1/44.2, Na 131, K 3.2, Cr 0.80, glucose 123. Imagine of the head and c-spine did not show any acute findings, but did not partially visualized right perihilar airspace consolidation prompting a CXR which showed RUL and RLL opacities felt consistent with PNA. She was discharged home on azithromycin (#10), Zofran as needed. (ED discharge medications also listed Acyclovir x 7 days, Cefdinir 300 mg (#20), Albuterol MDI as needed, Sterapred 10 mg/6 day pack_, Tessalon TID (#20), but unclear if these had been previously prescribed by her PCP.)   She had ED follow-up with her PCP Dr. Nelson Chimes on 04/04/21. BP 124/72. He had previously cleared her for surgery following 03/08/21 visit, but had follow-up visit 04/01/21 for fall/fever and sent to the ED, and then on 04/04/21 for ED follow-up. He notes she has back surgery scheduled the next week and also that she is having some migraine-type headaches. She denied SOB, wheezing or abnormal sputum production but documented "moderately rhonchi present - right base". He prescribed Ubrelvy as needed for migraines. With two month follow-up, sooner if headache or cough not better. Plan for repeat CXR in 8 weeks.    Per communication I received, I was under the impression that patient had more of an incidental finding of PNA during evaluation from her fall in the tub; However, notes suggest that she likely had acute PNA the first week of August. No acute respiratory symptoms  noted at PAT RN visit. Sats 98%. WBC 10K. I had also been notified that her PCP cleared her for surgery after a recent post-ED evaluation, but Dr. Shanda Bumps note does not specify this.  I spoke with anesthesiologist Val Eagle, MD, as well as Dr. Shon Baton. Need to confirm with PCP that he feels she is recovered and okay for surgery,  and if not would consider delaying for a few more weeks (4-6 weeks post PNA). Dr. Shon Baton plans to have his PA contact Dr. Nelson Chimes. I also left his nurse Crystal a voice message relaying anesthesiologist's input.   ADDENDUM 04/18/21 4:49 PM: Received communication from Dr. Shon Baton' office indicating that Dr. Nelson Chimes has recommended postponing until "evaluation of multi-focal pneumonia on R lung".    VS: BP (!) 151/80   Pulse 65   Temp 37.1 C (Oral)   Resp 18   Ht 5\' 1"  (1.549 m)   Wt 85.8 kg   SpO2 98%   BMI 35.75 kg/m  PAT RN notified , PA-C of BP readings 170/92 on arrival and 151/80 on recheck.   PROVIDERS: PCP is documented as Voncille Lo, MD [phone])   LABS: Labs reviewed: Acceptable for surgery. (all labs ordered are listed, but only abnormal results are displayed)  Labs Reviewed  BASIC METABOLIC PANEL - Abnormal; Notable for the following components:      Result Value   BUN <5 (*)    All other components within normal limits  APTT - Abnormal; Notable for the following components:   aPTT 21 (*)    All other components within normal limits  URINALYSIS, ROUTINE W REFLEX MICROSCOPIC - Abnormal; Notable for the following components:   APPearance HAZY (*)    All other components within normal limits  SURGICAL PCR SCREEN  CBC  PROTIME-INR  TYPE AND SCREEN     IMAGES: Currently unavailable. 1V PCXR 04/01/21 [facility]): Findings:  1.  The cardiomediastinal silhouette is within normal limits. 2.  There are confluent opacities in the right upper and lower lobes.  The left lung is clear.  There is no pleural effusion.  There is no pneumothorax. 3.  There is no acute osseous abnormality. Impression: Right upper and lower lobe pneumonia.  Recommend follow-up radiographs after treatment in 6 to 8 weeks to assess for resolution.  CT Head & C-spine 04/01/21 [facility]): IMPRESSION: 1.  No acute intracranial findings. 2.  No acute fracture or traumatic  listhesis of the cervical spine.  Degenerative disc disease is most pronounced at C5-6 and C6-7.  Relatively mild lower cervical facet arthropathy. 3.  Partially visualized right perihilar airspace consolidation. Recommend dedicated chest radiographs for further evaluation. - Aortic atherosclerosis.   EKG: 04/01/21 [facility]): Normal sinus rhythm.  Nonspecific ST and T wave abnormality.   CV: N/A  Past Medical History:  Diagnosis Date   Complication of anesthesia    difficulty waking up after anesthesia   Dyspnea    Pneumonia     Past Surgical History:  Procedure Laterality Date   ABDOMINAL HYSTERECTOMY     APPENDECTOMY     CHOLECYSTECTOMY     DILATION AND CURETTAGE OF UTERUS     EYE SURGERY     TONSILLECTOMY      MEDICATIONS:  acetaminophen (TYLENOL) 325 MG tablet   ibuprofen (ADVIL) 200 MG tablet   No current facility-administered medications for this encounter.    [facility], PA-C Surgical Short Stay/Anesthesiology [facility] Phone [phone] [facility] Phone [phone] 04/12/2021 7:40 PM

## 2021-04-20 ENCOUNTER — Inpatient Hospital Stay (HOSPITAL_COMMUNITY): Admission: RE | Admit: 2021-04-20 | Payer: Medicare Other | Source: Ambulatory Visit | Admitting: Orthopedic Surgery

## 2021-04-20 ENCOUNTER — Encounter (HOSPITAL_COMMUNITY): Admission: RE | Payer: Self-pay | Source: Ambulatory Visit

## 2021-04-20 DIAGNOSIS — J9811 Atelectasis: Secondary | ICD-10-CM | POA: Diagnosis not present

## 2021-04-20 DIAGNOSIS — I700 Atherosclerosis of aorta: Secondary | ICD-10-CM | POA: Diagnosis not present

## 2021-04-20 SURGERY — ANTERIOR LATERAL LUMBAR FUSION WITH PERCUTANEOUS SCREW 1 LEVEL
Anesthesia: General

## 2021-05-17 DIAGNOSIS — M5441 Lumbago with sciatica, right side: Secondary | ICD-10-CM | POA: Diagnosis not present

## 2021-05-17 DIAGNOSIS — G8929 Other chronic pain: Secondary | ICD-10-CM | POA: Diagnosis not present

## 2021-05-17 DIAGNOSIS — E039 Hypothyroidism, unspecified: Secondary | ICD-10-CM | POA: Diagnosis not present

## 2021-06-10 DIAGNOSIS — Z0000 Encounter for general adult medical examination without abnormal findings: Secondary | ICD-10-CM | POA: Diagnosis not present

## 2021-06-10 DIAGNOSIS — J449 Chronic obstructive pulmonary disease, unspecified: Secondary | ICD-10-CM | POA: Diagnosis not present

## 2021-06-10 DIAGNOSIS — M5442 Lumbago with sciatica, left side: Secondary | ICD-10-CM | POA: Diagnosis not present

## 2021-06-10 DIAGNOSIS — G8929 Other chronic pain: Secondary | ICD-10-CM | POA: Diagnosis not present

## 2021-06-10 DIAGNOSIS — I700 Atherosclerosis of aorta: Secondary | ICD-10-CM | POA: Diagnosis not present

## 2021-06-10 DIAGNOSIS — E039 Hypothyroidism, unspecified: Secondary | ICD-10-CM | POA: Diagnosis not present

## 2021-06-10 DIAGNOSIS — M5441 Lumbago with sciatica, right side: Secondary | ICD-10-CM | POA: Diagnosis not present

## 2021-06-24 DIAGNOSIS — I700 Atherosclerosis of aorta: Secondary | ICD-10-CM | POA: Diagnosis not present

## 2021-07-04 DIAGNOSIS — M545 Low back pain, unspecified: Secondary | ICD-10-CM | POA: Diagnosis not present

## 2021-07-05 DIAGNOSIS — Z1231 Encounter for screening mammogram for malignant neoplasm of breast: Secondary | ICD-10-CM | POA: Diagnosis not present

## 2021-07-20 DIAGNOSIS — M5416 Radiculopathy, lumbar region: Secondary | ICD-10-CM | POA: Insufficient documentation

## 2021-07-20 HISTORY — DX: Radiculopathy, lumbar region: M54.16

## 2021-07-28 DIAGNOSIS — M5416 Radiculopathy, lumbar region: Secondary | ICD-10-CM | POA: Diagnosis not present

## 2021-09-07 DIAGNOSIS — M5459 Other low back pain: Secondary | ICD-10-CM | POA: Diagnosis not present

## 2021-09-09 DIAGNOSIS — S3992XA Unspecified injury of lower back, initial encounter: Secondary | ICD-10-CM | POA: Diagnosis not present

## 2021-09-09 DIAGNOSIS — M545 Low back pain, unspecified: Secondary | ICD-10-CM | POA: Diagnosis not present

## 2021-09-09 DIAGNOSIS — M5459 Other low back pain: Secondary | ICD-10-CM | POA: Diagnosis not present

## 2021-09-23 DIAGNOSIS — M545 Low back pain, unspecified: Secondary | ICD-10-CM | POA: Diagnosis not present

## 2021-09-23 DIAGNOSIS — M5459 Other low back pain: Secondary | ICD-10-CM | POA: Diagnosis not present

## 2021-09-30 DIAGNOSIS — M545 Low back pain, unspecified: Secondary | ICD-10-CM | POA: Diagnosis not present

## 2021-10-05 DIAGNOSIS — M545 Low back pain, unspecified: Secondary | ICD-10-CM | POA: Diagnosis not present

## 2021-10-05 DIAGNOSIS — K760 Fatty (change of) liver, not elsewhere classified: Secondary | ICD-10-CM | POA: Diagnosis not present

## 2021-10-07 ENCOUNTER — Ambulatory Visit: Payer: Self-pay | Admitting: Orthopedic Surgery

## 2021-10-07 DIAGNOSIS — Z01818 Encounter for other preprocedural examination: Secondary | ICD-10-CM

## 2021-10-14 ENCOUNTER — Ambulatory Visit: Payer: Self-pay | Admitting: Orthopedic Surgery

## 2021-10-14 NOTE — H&P (Signed)
Subjective:   CC: LBP and right anterior thigh pain XLIF L3-4 w/ PSFI CONE 10/20/21  Past Medical History:  Diagnosis Date   Complication of anesthesia    difficulty waking up after anesthesia   Dyspnea    Pneumonia     Past Surgical History:  Procedure Laterality Date   ABDOMINAL HYSTERECTOMY     APPENDECTOMY     CHOLECYSTECTOMY     DILATION AND CURETTAGE OF UTERUS     EYE SURGERY     TONSILLECTOMY      Current Outpatient Medications  Medication Sig Dispense Refill Last Dose   ibuprofen (ADVIL) 800 MG tablet Take 800 mg by mouth daily as needed for moderate pain.      No current facility-administered medications for this visit.   No Known Allergies  Social History   Tobacco Use   Smoking status: Former    Packs/day: 1.00    Types: Cigarettes   Smokeless tobacco: Never  Substance Use Topics   Alcohol use: Never    No family history on file.  Review of Systems Pertinent items are noted in HPI.  Objective:   Vitals: Ht: 5 ft 1 in BP: 140/96 Pulse: 68 bpm  Clinical exam: Sakile is a pleasant individual, who appears younger than their stated age. She is alert and orientated 3. No shortness of breath, chest pain.  Heart: RRR  Lungs: CTAB  Abdomen is soft and non-tender, negative loss of bowel and bladder control, no rebound tenderness. BSx4  Negative: skin lesions abrasions contusions  Peripheral pulses: 2+ dorsalis pedis/posterior tibialis pulses bilaterally. Compartment soft and nontender.  Gait pattern: Normal Assistive devices: None  Neuro: 5/5 motor strength in the lower extremity bilaterally. Negative straight leg raise test, no clonus. Patient denies neuropathic leg pain. Negative Babinski test, 1+ deep tendon reflexes at the knee absent at the Achilles.  Musculoskeletal: Mild to moderate low back pain. No SI joint pain. Overall pain has improved since her last office visit. Patient noted approximately 2 days of improvement after the  07/28/2021 epidural steroid injection.  Lumbar MRI: completed on 09/09/2021 was reviewed with the patient. It was completed at [facility]; I have independently reviewed the images as well as the radiology report. No significant change in overall alignment from prior MRI. 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 Moderate lateral recess stenosis at L2-3 mild stenosis L4-5 and L5-S1. No fracture or abnormal marrow signal change.  Assessment:    Harbor presents today to discuss surgical intervention for her chronic back buttock and intermittent neuropathic leg pain. Patient states their pain level is 7-8/10 which progressively impairs activities of daily living as well as overall quality of life.  Diagnosis: At this point time the patient continues to have severe spinal stenosis with neurogenic claudication and debilitating back pain. I do believe the L3-4 level is still her primary pain generator. Last year she had failed conservative management and we are in the process of moving forward with surgery when she developed pneumonia and we had to postpone. At this point time her quality of life remains quite poor. She is received clearance from her primary care physician and so I think it is reasonable to move forward with the XLIF L3-4 with posterior pedicle screw fixation. I have gone over the surgical plan and all of her questions were addressed. She did review the literature that I provided.   Plan:    XLIF L3-4 with posterior  pedicle screw fixation  We have obtained preoperative medical clearance from the patient's PCP.  I reviewed the patient is medicated list, she is not on blodd thinners or ASA. SHe has stopped NSAIDs. Not taking any vitamins or supplements.  SHe has LSO brace.  We have also discussed the post-operative recovery period to include: bathing/showering restrictions, wound healing, activity (and driving)  restrictions, medications/pain mangement.  We have also discussed post-operative redflags to include: signs and symptoms of postoperative infection, DVT/PE.  Discharge instructions were reviewed with the patient. She was given a copy of her discharge instructions. All of her questions were invited and answered.  Follow-up: 2 weeks postop

## 2021-10-14 NOTE — Pre-Procedure Instructions (Signed)
Surgical Instructions    Your procedure is scheduled on Thursday, March 2nd.  Report to [facility] Main Entrance "A" at 12:15 P.M., then check in with the Admitting office.  Call this number if you have problems the morning of surgery:  [phone]   If you have any questions prior to your surgery date call [phone]: Open Monday-Friday 8am-4pm    Remember:  Do not eat after midnight the night before your surgery  You may drink clear liquids until 11:15 a.m. the morning of your surgery.   Clear liquids allowed are: Water, Non-Citrus Juices (without pulp), Carbonated Beverages, Clear Tea, Black Coffee Only (NO MILK, CREAM OR POWDERED CREAMER of any kind), and Gatorade.    Take these medicines the morning of surgery with A SIP OF WATER: NONE  As of today, STOP taking any Aspirin (unless otherwise instructed by your surgeon) Aleve, Naproxen, Ibuprofen, Motrin, Advil, Goody's, BC's, all herbal medications, fish oil, and all vitamins.                     Do NOT Smoke (Tobacco/Vaping) for 24 hours prior to your procedure.  If you use a CPAP at night, you may bring your mask/headgear for your overnight stay.   Contacts, glasses, piercing's, hearing aid's, dentures or partials may not be worn into surgery, please bring cases for these belongings.    For patients admitted to the hospital, discharge time will be determined by your treatment team.   Patients discharged the day of surgery will not be allowed to drive home, and someone needs to stay with them for 24 hours.  NO VISITORS WILL BE ALLOWED IN PRE-OP WHERE PATIENTS ARE PREPPED FOR SURGERY.  ONLY 1 SUPPORT PERSON MAY BE PRESENT IN THE WAITING ROOM WHILE YOU ARE IN SURGERY.  IF YOU ARE TO BE ADMITTED, ONCE YOU ARE IN YOUR ROOM YOU WILL BE ALLOWED TWO (2) VISITORS. (1) VISITOR MAY STAY OVERNIGHT BUT MUST ARRIVE TO THE ROOM BY 8pm.  Minor children may have two parents present. Special consideration for safety and communication needs  will be reviewed on a case by case basis.   Special instructions:   [facility]- Preparing For Surgery  Before surgery, you can play an important role. Because skin is not sterile, your skin needs to be as free of germs as possible. You can reduce the number of germs on your skin by washing with CHG (chlorahexidine gluconate) Soap before surgery.  CHG is an antiseptic cleaner which kills germs and bonds with the skin to continue killing germs even after washing.    Oral Hygiene is also important to reduce your risk of infection.  Remember - BRUSH YOUR TEETH THE MORNING OF SURGERY WITH YOUR REGULAR TOOTHPASTE  Please do not use if you have an allergy to CHG or antibacterial soaps. If your skin becomes reddened/irritated stop using the CHG.  Do not shave (including legs and underarms) for at least 48 hours prior to first CHG shower. It is OK to shave your face.  Please follow these instructions carefully.   Shower the NIGHT BEFORE SURGERY and the MORNING OF SURGERY  If you chose to wash your hair, wash your hair first as usual with your normal shampoo.  After you shampoo, rinse your hair and body thoroughly to remove the shampoo.  Use CHG Soap as you would any other liquid soap. You can apply CHG directly to the skin and wash gently with a scrungie or a clean washcloth.  Apply the CHG Soap to your body ONLY FROM THE NECK DOWN.  Do not use on open wounds or open sores. Avoid contact with your eyes, ears, mouth and genitals (private parts). Wash Face and genitals (private parts)  with your normal soap.   Wash thoroughly, paying special attention to the area where your surgery will be performed.  Thoroughly rinse your body with warm water from the neck down.  DO NOT shower/wash with your normal soap after using and rinsing off the CHG Soap.  Pat yourself dry with a CLEAN TOWEL.  Wear CLEAN PAJAMAS to bed the night before surgery  Place CLEAN SHEETS on your bed the night before your  surgery  DO NOT SLEEP WITH PETS.   Day of Surgery: Shower with CHG soap. Do not wear jewelry, make up, nail polish, gel polish, artificial nails, or any other type of covering on natural nails including finger and toenails. If patients have artificial nails, gel coating, etc. that need to be removed by a nail salon please have this removed prior to surgery. Surgery may need to be canceled/delayed if the surgeon/anesthesiologist feels like the patient is unable to be adequately monitored. Do not wear lotions, powders, perfumes, or deodorant. Do not shave 48 hours prior to surgery. Do not bring valuables to the hospital. [facility] is not responsible for any belongings or valuables. Wear Clean/Comfortable clothing the morning of surgery Remember to brush your teeth WITH YOUR REGULAR TOOTHPASTE.   Please read over the following fact sheets that you were given.   3 days prior to your procedure or After your COVID test   You are not required to quarantine however you are required to wear a well-fitting mask when you are out and around people not in your household. If your mask becomes wet or soiled, replace with a new one.   Wash your hands often with soap and water for 20 seconds or clean your hands with an alcohol-based hand sanitizer that contains at least 60% alcohol.   Do not share personal items.   Notify your provider:  o if you are in close contact with someone who has COVID  o or if you develop a fever of 100.4 or greater, sneezing, cough, sore throat, shortness of breath or body aches.

## 2021-10-14 NOTE — H&P (Deleted)
  The note originally documented on this encounter has been moved the the encounter in which it belongs.  

## 2021-10-17 ENCOUNTER — Encounter (HOSPITAL_COMMUNITY): Payer: Self-pay

## 2021-10-17 ENCOUNTER — Other Ambulatory Visit: Payer: Self-pay

## 2021-10-17 ENCOUNTER — Encounter (HOSPITAL_COMMUNITY)
Admission: RE | Admit: 2021-10-17 | Discharge: 2021-10-17 | Disposition: A | Discharge status: Home / Self Care | Payer: Medicare Other | Source: Ambulatory Visit | Attending: Orthopedic Surgery | Admitting: Orthopedic Surgery

## 2021-10-17 DIAGNOSIS — M48062 Spinal stenosis, lumbar region with neurogenic claudication: Secondary | ICD-10-CM | POA: Diagnosis not present

## 2021-10-17 DIAGNOSIS — Z79899 Other long term (current) drug therapy: Secondary | ICD-10-CM | POA: Diagnosis not present

## 2021-10-17 DIAGNOSIS — Z01818 Encounter for other preprocedural examination: Secondary | ICD-10-CM

## 2021-10-17 DIAGNOSIS — Z87891 Personal history of nicotine dependence: Secondary | ICD-10-CM | POA: Diagnosis not present

## 2021-10-17 DIAGNOSIS — Z20822 Contact with and (suspected) exposure to covid-19: Secondary | ICD-10-CM | POA: Insufficient documentation

## 2021-10-17 DIAGNOSIS — M4326 Fusion of spine, lumbar region: Secondary | ICD-10-CM | POA: Diagnosis not present

## 2021-10-17 DIAGNOSIS — Z9049 Acquired absence of other specified parts of digestive tract: Secondary | ICD-10-CM | POA: Diagnosis not present

## 2021-10-17 DIAGNOSIS — M5116 Intervertebral disc disorders with radiculopathy, lumbar region: Secondary | ICD-10-CM | POA: Diagnosis not present

## 2021-10-17 LAB — TYPE AND SCREEN
ABO/RH(D): O POS
Antibody Screen: NEGATIVE

## 2021-10-17 LAB — URINALYSIS, ROUTINE W REFLEX MICROSCOPIC
Bilirubin Urine: NEGATIVE
Glucose, UA: NEGATIVE mg/dL
Ketones, ur: NEGATIVE mg/dL
Leukocytes,Ua: NEGATIVE
Nitrite: NEGATIVE
Protein, ur: NEGATIVE mg/dL
Specific Gravity, Urine: 1.009 (ref 1.005–1.030)
pH: 6 (ref 5.0–8.0)

## 2021-10-17 LAB — BASIC METABOLIC PANEL WITH GFR
Anion gap: 10 (ref 5–15)
BUN: 7 mg/dL — ABNORMAL LOW (ref 8–23)
CO2: 29 mmol/L (ref 22–32)
Calcium: 9.8 mg/dL (ref 8.9–10.3)
Chloride: 100 mmol/L (ref 98–111)
Creatinine, Ser: 0.88 mg/dL (ref 0.44–1.00)
GFR, Estimated: >60 mL/min
Glucose, Bld: 91 mg/dL (ref 70–99)
Potassium: 3.4 mmol/L — ABNORMAL LOW (ref 3.5–5.1)
Sodium: 139 mmol/L (ref 135–145)

## 2021-10-17 LAB — CBC
HCT: 46.8 % — ABNORMAL HIGH (ref 36.0–46.0)
Hemoglobin: 15.7 g/dL — ABNORMAL HIGH (ref 12.0–15.0)
MCH: 33.3 pg (ref 26.0–34.0)
MCHC: 33.5 g/dL (ref 30.0–36.0)
MCV: 99.4 fL (ref 80.0–100.0)
Platelets: 304 K/uL (ref 150–400)
RBC: 4.71 MIL/uL (ref 3.87–5.11)
RDW: 13 % (ref 11.5–15.5)
WBC: 7.8 K/uL (ref 4.0–10.5)
nRBC: 0 % (ref 0.0–0.2)

## 2021-10-17 LAB — SURGICAL PCR SCREEN
MRSA, PCR: NEGATIVE
Staphylococcus aureus: NEGATIVE

## 2021-10-17 LAB — SARS CORONAVIRUS 2 BY RT PCR (HOSPITAL ORDER, PERFORMED IN ~~LOC~~ HOSPITAL LAB): SARS Coronavirus 2: NEGATIVE

## 2021-10-17 NOTE — Progress Notes (Signed)
PCP - [name], MD Cardiologist - denies  PPM/ICD - n/a  Chest x-ray - n/a EKG - n/a Stress Test - 10+ years ago. Pt unaware of where it was done. ECHO - denies Cardiac Cath - denies  Sleep Study - denies CPAP - denies  Blood Thinner Instructions: n/a Aspirin Instructions: n/a  ERAS Protcol -Clear liquids until 1115 DOS PRE-SURGERY Ensure or G2- none ordered.  COVID TEST- 10/17/21, done in PAT  Anesthesia review: yes, per order by surgeon.  Patient denies shortness of breath, fever, cough and chest pain at PAT appointment   All instructions explained to the patient, with a verbal understanding of the material. Patient agrees to go over the instructions while at home for a better understanding. Patient also instructed to self quarantine after being tested for COVID-19. The opportunity to ask questions was provided.

## 2021-10-19 NOTE — Progress Notes (Addendum)
Anesthesia Chart Review:  I spoke with patient at preadmission testing appointment due to elevated blood pressure.  192/93 on arrival, 154/96 on recheck.  Patient reports history of whitecoat hypertension.  She states she is not diagnosed hypertension and takes no antihypertensive medications.  She admits to being quite nervous about her upcoming procedure and is also having severe low back pain.  She says she has a blood pressure cuff at home and does check it occasionally and numbers at home are generally much better.  She denies any headache, chest pain, new shortness of breath (she has baseline mild COPD but states this is stable, no increased DOE).  She denies any history of cardiac disease.  EKG shows NSR with rate 69, nonspecific ST and T wave abnormality.  She was instructed to check her blood pressure at home and record a log for the next couple of days.    I called the patient for follow-up on 10/19/2021.  She states that she has been checking her blood pressure multiple times per day and the highest reading she is recorded is 152/80 and the lowest reading is 127/82.  She reports that she is feeling well overall but is still quite anxious about upcoming surgery.  She will continue to monitor her blood pressure and follow-up with her PCP if she does notice sustained elevations.  She understands that markedly elevated blood pressure on day of surgery could be cause for cancellation.  It is likely that her blood pressure will be high when she initially presents, however, anticipate it may improve on recheck.  Of note, patient reports previous terrible experience with anesthesia at [facility].  She says several years ago she had numerous teeth broken during intubation and also had prolonged emergence.  She reports she had subsequent surgery at another facility without complication, no prolonged emergence.  She does report that her experience at [facility] has made her very nervous about  general anesthesia.  PCP clearance from Dr. [name] dated 09/23/2021 states, "she is cleared for back surgery from medical point of view."  Copy on chart.  Preop labs reviewed, unremarkable.   Zannie Cove [facility] Short Stay Center/Anesthesiology Phone [phone] 10/19/2021 3:15 PM'

## 2021-10-19 NOTE — Anesthesia Preprocedure Evaluation (Addendum)
Anesthesia Evaluation  ?Patient identified by MRN, date of birth, ID band ?Patient awake ? ? ? ?Reviewed: ?Allergy & Precautions, NPO status , Patient's Chart, lab work & pertinent test results ? ?Airway ?Mallampati: II ? ?TM Distance: >3 FB ?Neck ROM: Full ? ? ? Dental ?no notable dental hx. ?(+) Missing, Dental Advisory Given, Poor Dentition,  ?  ?Pulmonary ?pneumonia (Pneumonia in Aug 22), resolved, former smoker,  ?  ?Pulmonary exam normal ?breath sounds clear to auscultation ? ? ? ? ? ? Cardiovascular ?hypertension, Pt. on medications ?Normal cardiovascular exam ?Rhythm:Regular Rate:Normal ? ? ?  ?Neuro/Psych ?  ? GI/Hepatic ?Neg liver ROS,   ?Endo/Other  ? ? Renal/GU ?Lab Results ?     Component                Value               Date                 ?     CREATININE               0.88                10/17/2021           ?     BUN                      7 (L)               10/17/2021           ?     NA                       139                 10/17/2021           ?     K                        3.4 (L)             10/17/2021           ?     CL                       100                 10/17/2021           ?     CO2                      29                  10/17/2021           ?  ? ?  ?Musculoskeletal ? ? Abdominal ?(+) + obese (BMI 38.22),   ?Peds ? Hematology ?Lab Results ?     Component                Value               Date                 ?     WBC                      7.8  10/17/2021           ?     HGB                      15.7 (H)            10/17/2021           ?     HCT                      46.8 (H)            10/17/2021           ?     MCV                      99.4                10/17/2021           ?     PLT                      304                 10/17/2021           ?   ?Anesthesia Other Findings ?NKDA ? Reproductive/Obstetrics ? ?  ? ? ? ? ? ? ? ? ? ? ? ? ? ?  ?  ? ? ? ? ? ? ?Anesthesia Physical ?Anesthesia Plan ? ?ASA: 3 ? ?Anesthesia Plan:  General  ? ?Post-op Pain Management: Regional block* and Dilaudid IV  ? ?Induction:  ? ?PONV Risk Score and Plan: 4 or greater and Treatment may vary due to age or medical condition and Ondansetron ? ?Airway Management Planned: Oral ETT ? ?Additional Equipment:  ? ?Intra-op Plan:  ? ?Post-operative Plan: Extubation in OR ? ?Informed Consent:  ? ? ? ?Dental advisory given ? ?Plan Discussed with:  ? ?Anesthesia Plan Comments: (PAT note by Karoline Caldwell, PA-C: ? ?I spoke with patient at preadmission testing appointment due to elevated blood pressure.  192/93 on arrival, 154/96 on recheck.  Patient reports history of whitecoat hypertension.  She states she is not diagnosed hypertension and takes no antihypertensive medications.  She admits to being quite nervous about her upcoming procedure and is also having severe low back pain.  She says she has a blood pressure cuff at home and does check it occasionally and numbers at home are generally much better.  She denies any headache, chest pain, new shortness of breath (she has baseline mild COPD but states this is stable, no increased DOE).  She denies any history of cardiac disease.  EKG shows NSR with rate 69, nonspecific ST and T wave abnormality.  She was instructed to check her blood pressure at home and record a log for the next couple of days.   ? ?I called the patient for follow-up on 10/19/2021.  She states that she has been checking her blood pressure multiple times per day and the highest reading she is recorded is 152/80 and the lowest reading is 127/82.  She reports that she is feeling well overall but is still quite anxious about upcoming surgery.  She will continue to monitor her blood pressure and follow-up with her PCP if she does notice sustained elevations.  She understands that markedly elevated blood pressure on day of surgery could be cause for cancellation.  It is likely that her  blood pressure will be high when she initially presents, however,  anticipate it may improve on recheck. ? ?Of note, patient reports previous terrible experience with anesthesia at [facility].  She says several years ago she had numerous teeth broken during intubation and also had prolonged emergence.  She reports she had subsequent surgery at another facility without complication, no prolonged emergence.  She does report that her experience at [facility] has made her very nervous about general anesthesia. ? ?PCP clearance from Dr. Garwin Brothers dated 09/23/2021 states, "she is cleared for back surgery from medical point of view."  Copy on chart. ? ?Preop labs reviewed, unremarkable. ? ?)  ? ? ? ? ? ?Anesthesia Quick Evaluation ? ?

## 2021-10-20 ENCOUNTER — Inpatient Hospital Stay (HOSPITAL_COMMUNITY)
Admission: RE | Admit: 2021-10-20 | Discharge: 2021-10-21 | DRG: 460 | Disposition: A | Discharge status: Home / Self Care | Payer: Medicare Other | Attending: Orthopedic Surgery | Admitting: Orthopedic Surgery

## 2021-10-20 ENCOUNTER — Other Ambulatory Visit: Payer: Self-pay

## 2021-10-20 ENCOUNTER — Inpatient Hospital Stay (HOSPITAL_COMMUNITY): Payer: Medicare Other

## 2021-10-20 ENCOUNTER — Inpatient Hospital Stay (HOSPITAL_COMMUNITY): Payer: Medicare Other | Admitting: Anesthesiology

## 2021-10-20 ENCOUNTER — Encounter (HOSPITAL_COMMUNITY): Payer: Self-pay | Admitting: Orthopedic Surgery

## 2021-10-20 ENCOUNTER — Encounter (HOSPITAL_COMMUNITY)
Admission: RE | Disposition: A | Discharge status: Home / Self Care | Payer: Self-pay | Source: Home / Self Care | Attending: Orthopedic Surgery

## 2021-10-20 ENCOUNTER — Inpatient Hospital Stay (HOSPITAL_COMMUNITY): Payer: Medicare Other | Admitting: Physician Assistant

## 2021-10-20 DIAGNOSIS — I10 Essential (primary) hypertension: Secondary | ICD-10-CM

## 2021-10-20 DIAGNOSIS — M5116 Intervertebral disc disorders with radiculopathy, lumbar region: Secondary | ICD-10-CM | POA: Diagnosis not present

## 2021-10-20 DIAGNOSIS — M48062 Spinal stenosis, lumbar region with neurogenic claudication: Secondary | ICD-10-CM | POA: Diagnosis not present

## 2021-10-20 DIAGNOSIS — Z419 Encounter for procedure for purposes other than remedying health state, unspecified: Secondary | ICD-10-CM

## 2021-10-20 DIAGNOSIS — M5416 Radiculopathy, lumbar region: Secondary | ICD-10-CM | POA: Diagnosis not present

## 2021-10-20 DIAGNOSIS — Z981 Arthrodesis status: Secondary | ICD-10-CM

## 2021-10-20 DIAGNOSIS — M4326 Fusion of spine, lumbar region: Secondary | ICD-10-CM | POA: Diagnosis not present

## 2021-10-20 DIAGNOSIS — M5136 Other intervertebral disc degeneration, lumbar region: Secondary | ICD-10-CM | POA: Diagnosis not present

## 2021-10-20 DIAGNOSIS — Z90710 Acquired absence of both cervix and uterus: Secondary | ICD-10-CM

## 2021-10-20 DIAGNOSIS — Z87891 Personal history of nicotine dependence: Secondary | ICD-10-CM

## 2021-10-20 DIAGNOSIS — Z79899 Other long term (current) drug therapy: Secondary | ICD-10-CM | POA: Diagnosis not present

## 2021-10-20 DIAGNOSIS — Z9049 Acquired absence of other specified parts of digestive tract: Secondary | ICD-10-CM | POA: Diagnosis not present

## 2021-10-20 DIAGNOSIS — Z20822 Contact with and (suspected) exposure to covid-19: Secondary | ICD-10-CM | POA: Diagnosis present

## 2021-10-20 DIAGNOSIS — G8918 Other acute postprocedural pain: Secondary | ICD-10-CM | POA: Diagnosis not present

## 2021-10-20 HISTORY — PX: ANTERIOR LATERAL LUMBAR FUSION WITH PERCUTANEOUS SCREW 1 LEVEL: SHX5553

## 2021-10-20 HISTORY — DX: Arthrodesis status: Z98.1

## 2021-10-20 SURGERY — ANTERIOR LATERAL LUMBAR FUSION WITH PERCUTANEOUS SCREW 1 LEVEL
Anesthesia: General | Site: Spine Lumbar

## 2021-10-20 MED ORDER — ONDANSETRON HCL 4 MG PO TABS: 4.0000 mg | ORAL_TABLET | Freq: Four times a day (QID) | ORAL | Status: DC | PRN

## 2021-10-20 MED ORDER — PROPOFOL 10 MG/ML IV BOLUS
INTRAVENOUS | Status: DC | PRN
  Administered 2021-10-20: 50 mg via INTRAVENOUS
  Administered 2021-10-20: 150 mg via INTRAVENOUS
  Administered 2021-10-20 (×2): 50 mg via INTRAVENOUS

## 2021-10-20 MED ORDER — THROMBIN 20000 UNITS EX SOLR: CUTANEOUS | Status: DC | PRN

## 2021-10-20 MED ORDER — SODIUM CHLORIDE 0.9% FLUSH: 3.0000 mL | INTRAVENOUS | Status: DC | PRN

## 2021-10-20 MED ORDER — MEPERIDINE HCL 25 MG/ML IJ SOLN: 6.2500 mg | INTRAMUSCULAR | Status: DC | PRN

## 2021-10-20 MED ORDER — BUPIVACAINE-EPINEPHRINE (PF) 0.25% -1:200000 IJ SOLN
INTRAMUSCULAR | Status: AC
  Filled 2021-10-20: qty 30

## 2021-10-20 MED ORDER — 0.9 % SODIUM CHLORIDE (POUR BTL) OPTIME
TOPICAL | Status: DC | PRN
  Administered 2021-10-20: 1000 mL

## 2021-10-20 MED ORDER — MIDAZOLAM HCL 2 MG/2ML IJ SOLN: 0.5000 mg | Freq: Once | INTRAMUSCULAR | Status: DC | PRN

## 2021-10-20 MED ORDER — OXYCODONE HCL 5 MG PO TABS
10.0000 mg | ORAL_TABLET | ORAL | Status: DC | PRN
  Administered 2021-10-20 – 2021-10-21 (×4): 10 mg via ORAL
  Filled 2021-10-20 (×4): qty 2

## 2021-10-20 MED ORDER — HYDROMORPHONE HCL 1 MG/ML IJ SOLN
INTRAMUSCULAR | Status: AC
  Filled 2021-10-20: qty 1

## 2021-10-20 MED ORDER — METHOCARBAMOL 500 MG PO TABS
500.0000 mg | ORAL_TABLET | Freq: Four times a day (QID) | ORAL | Status: DC | PRN
  Administered 2021-10-20 – 2021-10-21 (×2): 500 mg via ORAL
  Filled 2021-10-20: qty 1

## 2021-10-20 MED ORDER — HYDROMORPHONE HCL 1 MG/ML IJ SOLN
INTRAMUSCULAR | Status: AC
  Filled 2021-10-20: qty 0.5

## 2021-10-20 MED ORDER — ACETAMINOPHEN 10 MG/ML IV SOLN
INTRAVENOUS | Status: DC | PRN
  Administered 2021-10-20: 1000 mg via INTRAVENOUS

## 2021-10-20 MED ORDER — THROMBIN 20000 UNITS EX SOLR
CUTANEOUS | Status: AC
  Filled 2021-10-20: qty 20000

## 2021-10-20 MED ORDER — LACTATED RINGERS IV SOLN: INTRAVENOUS | Status: DC

## 2021-10-20 MED ORDER — OXYCODONE HCL 5 MG/5ML PO SOLN: 5.0000 mg | Freq: Once | ORAL | Status: AC | PRN

## 2021-10-20 MED ORDER — GABAPENTIN 300 MG PO CAPS
300.0000 mg | ORAL_CAPSULE | Freq: Three times a day (TID) | ORAL | Status: DC
  Administered 2021-10-20: 300 mg via ORAL
  Filled 2021-10-20 (×2): qty 1

## 2021-10-20 MED ORDER — KETOROLAC TROMETHAMINE 30 MG/ML IJ SOLN
INTRAMUSCULAR | Status: AC
  Filled 2021-10-20: qty 1

## 2021-10-20 MED ORDER — DEXAMETHASONE SODIUM PHOSPHATE 10 MG/ML IJ SOLN
INTRAMUSCULAR | Status: DC | PRN
  Administered 2021-10-20: 10 mg via INTRAVENOUS

## 2021-10-20 MED ORDER — FENTANYL CITRATE (PF) 100 MCG/2ML IJ SOLN
INTRAMUSCULAR | Status: AC
  Administered 2021-10-20: 50 ug via INTRAVENOUS
  Filled 2021-10-20: qty 2

## 2021-10-20 MED ORDER — SODIUM CHLORIDE 0.9 % IV SOLN
0.1500 ug/kg/min | INTRAVENOUS | Status: DC
  Filled 2021-10-20 (×2): qty 2000

## 2021-10-20 MED ORDER — BUPIVACAINE-EPINEPHRINE 0.25% -1:200000 IJ SOLN
INTRAMUSCULAR | Status: DC | PRN
Start: 2021-10-20 — End: 2021-10-20
  Administered 2021-10-20: 19 mL

## 2021-10-20 MED ORDER — LIDOCAINE 2% (20 MG/ML) 5 ML SYRINGE
INTRAMUSCULAR | Status: AC
  Filled 2021-10-20: qty 5

## 2021-10-20 MED ORDER — ONDANSETRON HCL 4 MG/2ML IJ SOLN
INTRAMUSCULAR | Status: DC | PRN
  Administered 2021-10-20: 4 mg via INTRAVENOUS

## 2021-10-20 MED ORDER — CHLORHEXIDINE GLUCONATE 0.12 % MT SOLN
15.0000 mL | Freq: Once | OROMUCOSAL | Status: AC
  Administered 2021-10-20: 15 mL via OROMUCOSAL
  Filled 2021-10-20: qty 15

## 2021-10-20 MED ORDER — HYDROMORPHONE HCL 1 MG/ML IJ SOLN
0.2500 mg | INTRAMUSCULAR | Status: DC | PRN
  Administered 2021-10-20 (×4): 0.5 mg via INTRAVENOUS

## 2021-10-20 MED ORDER — SODIUM CHLORIDE 0.9 % IV SOLN: 250.0000 mL | INTRAVENOUS | Status: DC

## 2021-10-20 MED ORDER — DEXAMETHASONE SODIUM PHOSPHATE 4 MG/ML IJ SOLN
4.0000 mg | Freq: Four times a day (QID) | INTRAMUSCULAR | Status: DC
  Administered 2021-10-20 – 2021-10-21 (×2): 4 mg via INTRAVENOUS
  Filled 2021-10-20 (×2): qty 1

## 2021-10-20 MED ORDER — PROPOFOL 10 MG/ML IV BOLUS
INTRAVENOUS | Status: AC
  Filled 2021-10-20: qty 20

## 2021-10-20 MED ORDER — ONDANSETRON HCL 4 MG PO TABS: 4.0000 mg | ORAL_TABLET | Freq: Three times a day (TID) | ORAL | 0 refills | Status: AC | PRN

## 2021-10-20 MED ORDER — PHENOL 1.4 % MT LIQD: 1.0000 | OROMUCOSAL | Status: DC | PRN

## 2021-10-20 MED ORDER — SUCCINYLCHOLINE CHLORIDE 200 MG/10ML IV SOSY
PREFILLED_SYRINGE | INTRAVENOUS | Status: DC | PRN
  Administered 2021-10-20: 100 mg via INTRAVENOUS

## 2021-10-20 MED ORDER — SURGIFLO WITH THROMBIN (HEMOSTATIC MATRIX KIT) OPTIME
TOPICAL | Status: DC | PRN
  Administered 2021-10-20: 1 via TOPICAL

## 2021-10-20 MED ORDER — METHOCARBAMOL 1000 MG/10ML IJ SOLN
500.0000 mg | Freq: Four times a day (QID) | INTRAVENOUS | Status: DC | PRN
  Filled 2021-10-20: qty 5

## 2021-10-20 MED ORDER — HYDROMORPHONE HCL 1 MG/ML IJ SOLN: 1.0000 mg | INTRAMUSCULAR | Status: DC | PRN

## 2021-10-20 MED ORDER — ACETAMINOPHEN 650 MG RE SUPP: 650.0000 mg | RECTAL | Status: DC | PRN

## 2021-10-20 MED ORDER — CEFAZOLIN SODIUM-DEXTROSE 1-4 GM/50ML-% IV SOLN
1.0000 g | Freq: Three times a day (TID) | INTRAVENOUS | Status: AC
  Administered 2021-10-20 – 2021-10-21 (×2): 1 g via INTRAVENOUS
  Filled 2021-10-20 (×2): qty 50

## 2021-10-20 MED ORDER — OXYCODONE HCL 5 MG PO TABS: 5.0000 mg | ORAL_TABLET | ORAL | Status: DC | PRN

## 2021-10-20 MED ORDER — BUPIVACAINE HCL (PF) 0.5 % IJ SOLN
INTRAMUSCULAR | Status: DC | PRN
  Administered 2021-10-20: 15 mL

## 2021-10-20 MED ORDER — POLYETHYLENE GLYCOL 3350 17 G PO PACK
17.0000 g | PACK | Freq: Every day | ORAL | Status: DC | PRN
  Administered 2021-10-20: 17 g via ORAL
  Filled 2021-10-20: qty 1

## 2021-10-20 MED ORDER — MIDAZOLAM HCL 2 MG/2ML IJ SOLN
INTRAMUSCULAR | Status: AC
  Administered 2021-10-20: 2 mg via INTRAVENOUS
  Filled 2021-10-20: qty 2

## 2021-10-20 MED ORDER — FENTANYL CITRATE (PF) 250 MCG/5ML IJ SOLN
INTRAMUSCULAR | Status: AC
  Filled 2021-10-20: qty 5

## 2021-10-20 MED ORDER — SUCCINYLCHOLINE CHLORIDE 200 MG/10ML IV SOSY
PREFILLED_SYRINGE | INTRAVENOUS | Status: AC
  Filled 2021-10-20: qty 10

## 2021-10-20 MED ORDER — MIDAZOLAM HCL 2 MG/2ML IJ SOLN: 2.0000 mg | Freq: Once | INTRAMUSCULAR | Status: AC

## 2021-10-20 MED ORDER — ONDANSETRON HCL 4 MG/2ML IJ SOLN
INTRAMUSCULAR | Status: AC
  Filled 2021-10-20: qty 2

## 2021-10-20 MED ORDER — FENTANYL CITRATE (PF) 100 MCG/2ML IJ SOLN: 50.0000 ug | Freq: Once | INTRAMUSCULAR | Status: AC

## 2021-10-20 MED ORDER — DEXAMETHASONE 4 MG PO TABS
4.0000 mg | ORAL_TABLET | Freq: Four times a day (QID) | ORAL | Status: DC
  Administered 2021-10-21 (×2): 4 mg via ORAL
  Filled 2021-10-20 (×2): qty 1

## 2021-10-20 MED ORDER — FLEET ENEMA 7-19 GM/118ML RE ENEM: 1.0000 | ENEMA | Freq: Once | RECTAL | Status: DC | PRN

## 2021-10-20 MED ORDER — THROMBIN 20000 UNITS EX KIT
PACK | CUTANEOUS | Status: DC | PRN
  Administered 2021-10-20: 20 mL via TOPICAL

## 2021-10-20 MED ORDER — HYDROMORPHONE HCL 1 MG/ML IJ SOLN
INTRAMUSCULAR | Status: DC | PRN
  Administered 2021-10-20 (×5): .1 mg via INTRAVENOUS

## 2021-10-20 MED ORDER — EPHEDRINE 5 MG/ML INJ
INTRAVENOUS | Status: AC
  Filled 2021-10-20: qty 5

## 2021-10-20 MED ORDER — FENTANYL CITRATE (PF) 250 MCG/5ML IJ SOLN
INTRAMUSCULAR | Status: DC | PRN
  Administered 2021-10-20: 50 ug via INTRAVENOUS
  Administered 2021-10-20: 100 ug via INTRAVENOUS
  Administered 2021-10-20 (×5): 20 ug via INTRAVENOUS

## 2021-10-20 MED ORDER — MENTHOL 3 MG MT LOZG: 1.0000 | LOZENGE | OROMUCOSAL | Status: DC | PRN

## 2021-10-20 MED ORDER — ALBUTEROL SULFATE HFA 108 (90 BASE) MCG/ACT IN AERS
INHALATION_SPRAY | RESPIRATORY_TRACT | Status: DC | PRN
  Administered 2021-10-20: 8 via RESPIRATORY_TRACT

## 2021-10-20 MED ORDER — EPHEDRINE SULFATE-NACL 50-0.9 MG/10ML-% IV SOSY
PREFILLED_SYRINGE | INTRAVENOUS | Status: DC | PRN
  Administered 2021-10-20 (×3): 5 mg via INTRAVENOUS

## 2021-10-20 MED ORDER — ALBUTEROL SULFATE HFA 108 (90 BASE) MCG/ACT IN AERS
INHALATION_SPRAY | RESPIRATORY_TRACT | Status: AC
  Filled 2021-10-20: qty 6.7

## 2021-10-20 MED ORDER — ACETAMINOPHEN 325 MG PO TABS
650.0000 mg | ORAL_TABLET | ORAL | Status: DC | PRN
  Administered 2021-10-21: 650 mg via ORAL
  Filled 2021-10-20: qty 2

## 2021-10-20 MED ORDER — LIDOCAINE 2% (20 MG/ML) 5 ML SYRINGE
INTRAMUSCULAR | Status: DC | PRN
Start: 2021-10-20 — End: 2021-10-20
  Administered 2021-10-20: 100 mg via INTRAVENOUS

## 2021-10-20 MED ORDER — BUPIVACAINE LIPOSOME 1.3 % IJ SUSP
INTRAMUSCULAR | Status: DC | PRN
Start: 2021-10-20 — End: 2021-10-20
  Administered 2021-10-20: 10 mL

## 2021-10-20 MED ORDER — OXYCODONE HCL 5 MG PO TABS
5.0000 mg | ORAL_TABLET | Freq: Once | ORAL | Status: AC | PRN
  Administered 2021-10-20: 5 mg via ORAL

## 2021-10-20 MED ORDER — DEXAMETHASONE SODIUM PHOSPHATE 10 MG/ML IJ SOLN
INTRAMUSCULAR | Status: AC
  Filled 2021-10-20: qty 1

## 2021-10-20 MED ORDER — PROMETHAZINE HCL 25 MG/ML IJ SOLN
INTRAMUSCULAR | Status: AC
  Filled 2021-10-20: qty 1

## 2021-10-20 MED ORDER — OXYCODONE HCL 5 MG PO TABS
ORAL_TABLET | ORAL | Status: AC
  Filled 2021-10-20: qty 1

## 2021-10-20 MED ORDER — METHOCARBAMOL 500 MG PO TABS: 500.0000 mg | ORAL_TABLET | Freq: Three times a day (TID) | ORAL | 0 refills | Status: AC | PRN

## 2021-10-20 MED ORDER — ACETAMINOPHEN 10 MG/ML IV SOLN
INTRAVENOUS | Status: AC
  Filled 2021-10-20: qty 100

## 2021-10-20 MED ORDER — ORAL CARE MOUTH RINSE: 15.0000 mL | Freq: Once | OROMUCOSAL | Status: AC

## 2021-10-20 MED ORDER — ONDANSETRON HCL 4 MG/2ML IJ SOLN: 4.0000 mg | Freq: Four times a day (QID) | INTRAMUSCULAR | Status: DC | PRN

## 2021-10-20 MED ORDER — METHOCARBAMOL 500 MG PO TABS
ORAL_TABLET | ORAL | Status: AC
  Filled 2021-10-20: qty 1

## 2021-10-20 MED ORDER — PHENYLEPHRINE HCL-NACL 20-0.9 MG/250ML-% IV SOLN
INTRAVENOUS | Status: DC | PRN
Start: 2021-10-20 — End: 2021-10-20
  Administered 2021-10-20: 50 ug/min via INTRAVENOUS

## 2021-10-20 MED ORDER — KETOROLAC TROMETHAMINE 30 MG/ML IJ SOLN
INTRAMUSCULAR | Status: DC | PRN
  Administered 2021-10-20: 15 mg via INTRAVENOUS

## 2021-10-20 MED ORDER — OXYCODONE-ACETAMINOPHEN 10-325 MG PO TABS: 1.0000 | ORAL_TABLET | Freq: Four times a day (QID) | ORAL | 0 refills | Status: AC | PRN

## 2021-10-20 MED ORDER — SODIUM CHLORIDE 0.9 % IV SOLN
INTRAVENOUS | Status: DC | PRN
  Administered 2021-10-20: .15 ug/kg/min via INTRAVENOUS

## 2021-10-20 MED ORDER — PROMETHAZINE HCL 25 MG/ML IJ SOLN
6.2500 mg | Freq: Once | INTRAMUSCULAR | Status: AC
  Administered 2021-10-20: 6.25 mg via INTRAVENOUS

## 2021-10-20 MED ORDER — PROPOFOL 500 MG/50ML IV EMUL
INTRAVENOUS | Status: DC | PRN
Start: 2021-10-20 — End: 2021-10-20
  Administered 2021-10-20: 75 ug/kg/min via INTRAVENOUS

## 2021-10-20 MED ORDER — CEFAZOLIN SODIUM-DEXTROSE 2-4 GM/100ML-% IV SOLN
2.0000 g | INTRAVENOUS | Status: AC
  Administered 2021-10-20: 2 g via INTRAVENOUS
  Filled 2021-10-20: qty 100

## 2021-10-20 MED ORDER — SODIUM CHLORIDE 0.9% FLUSH: 3.0000 mL | Freq: Two times a day (BID) | INTRAVENOUS | Status: DC

## 2021-10-20 SURGICAL SUPPLY — 106 items
ADH SKN CLS APL DERMABOND .7 (GAUZE/BANDAGES/DRESSINGS)
AGENT HMST KT MTR STRL THRMB (HEMOSTASIS) ×1
APPLIER CLIP 11 MED OPEN (CLIP)
APR CLP MED 11 20 MLT OPN (CLIP)
BAG COUNTER SPONGE SURGICOUNT (BAG) ×3 IMPLANT
BAG SPNG CNTER NS LX DISP (BAG) ×2
BLADE CLIPPER SURG (BLADE) IMPLANT
BLADE ILLUMINATOR MIS (MISCELLANEOUS) ×1 IMPLANT
BLADE SURG 10 STRL SS (BLADE) ×1 IMPLANT
CANNULA A INSULATED (CANNULA) ×1 IMPLANT
CANNULA B INSULATED (CANNULA) ×1 IMPLANT
CLIP APPLIE 11 MED OPEN (CLIP) IMPLANT
CLIP PULSE STIMULATION (NEUROSURGERY SUPPLIES) ×1 IMPLANT
CLSR STERI-STRIP ANTIMIC 1/2X4 (GAUZE/BANDAGES/DRESSINGS) ×1 IMPLANT
CORD BIPOLAR FORCEPS 12FT (ELECTRODE) ×2 IMPLANT
COVER SURGICAL LIGHT HANDLE (MISCELLANEOUS) ×3 IMPLANT
DERMABOND ADVANCED (GAUZE/BANDAGES/DRESSINGS)
DERMABOND ADVANCED .7 DNX12 (GAUZE/BANDAGES/DRESSINGS) ×1 IMPLANT
DISSECTOR STICK (MISCELLANEOUS) ×1 IMPLANT
DRAIN CHANNEL 15F RND FF W/TCR (WOUND CARE) IMPLANT
DRAPE C-ARM 42X72 X-RAY (DRAPES) ×2 IMPLANT
DRAPE ORTHO SPLIT 77X108 STRL (DRAPES)
DRAPE POUCH INSTRU U-SHP 10X18 (DRAPES) ×2 IMPLANT
DRAPE SURG ORHT 6 SPLT 77X108 (DRAPES) ×1 IMPLANT
DRAPE U-SHAPE 47X51 STRL (DRAPES) ×4 IMPLANT
DRSG AQUACEL AG ADV 3.5X 6 (GAUZE/BANDAGES/DRESSINGS) ×1 IMPLANT
DRSG OPSITE POSTOP 3X4 (GAUZE/BANDAGES/DRESSINGS) ×1 IMPLANT
DRSG OPSITE POSTOP 4X6 (GAUZE/BANDAGES/DRESSINGS) ×3 IMPLANT
DURAPREP 26ML APPLICATOR (WOUND CARE) ×2 IMPLANT
ELECT BLADE 4.0 EZ CLEAN MEGAD (MISCELLANEOUS) ×2
ELECT CAUTERY BLADE 6.4 (BLADE) ×2 IMPLANT
ELECT PENCIL ROCKER SW 15FT (MISCELLANEOUS) ×2 IMPLANT
ELECT REM PT RETURN 9FT ADLT (ELECTROSURGICAL) ×2
ELECTRODE BLDE 4.0 EZ CLN MEGD (MISCELLANEOUS) ×1 IMPLANT
ELECTRODE REM PT RTRN 9FT ADLT (ELECTROSURGICAL) ×1 IMPLANT
FEE INTRAOP CADWELL SUPPLY NCS (MISCELLANEOUS) IMPLANT
FEE INTRAOP MONITOR IMPULS NCS (MISCELLANEOUS) IMPLANT
FORCEPS BPLR BAYO 10IN 1.0TIP (ORTHOPEDIC DISPOSABLE SUPPLIES) ×1 IMPLANT
GAUZE 4X4 16PLY ~~LOC~~+RFID DBL (SPONGE) ×2 IMPLANT
GLOVE SURG ENC MOIS LTX SZ6.5 (GLOVE) ×2 IMPLANT
GLOVE SURG MICRO LTX SZ8.5 (GLOVE) ×2 IMPLANT
GLOVE SURG UNDER POLY LF SZ6.5 (GLOVE) ×2 IMPLANT
GLOVE SURG UNDER POLY LF SZ8.5 (GLOVE) ×2 IMPLANT
GOWN STRL REUS W/ TWL LRG LVL3 (GOWN DISPOSABLE) ×1 IMPLANT
GOWN STRL REUS W/TWL 2XL LVL3 (GOWN DISPOSABLE) ×4 IMPLANT
GOWN STRL REUS W/TWL LRG LVL3 (GOWN DISPOSABLE) ×2
GUIDEWIRE NITINOL BEVEL TIP (WIRE) ×4 IMPLANT
INTRAOP CADWELL SUPPLY FEE NCS (MISCELLANEOUS) ×1
INTRAOP DISP SUPPLY FEE NCS (MISCELLANEOUS) ×2
INTRAOP MONITOR FEE IMPULS NCS (MISCELLANEOUS) ×1
INTRAOP MONITOR FEE IMPULSE (MISCELLANEOUS) ×2
K-WIRE  1.6X 450L (WIRE) ×2
K-WIRE 1.6X 450L (WIRE) ×1
KIT BASIN OR (CUSTOM PROCEDURE TRAY) ×2 IMPLANT
KIT PEDICLE ACCESS (KITS) ×1 IMPLANT
KIT PULSE MIOM NDL (NEUROSURGERY SUPPLIES) IMPLANT
KIT PULSE MIOM NEEDLE (NEUROSURGERY SUPPLIES) ×2 IMPLANT
KIT TURNOVER KIT B (KITS) ×2 IMPLANT
KWIRE 1.6X 450L (WIRE) IMPLANT
NDL I-PASS III (NEEDLE) IMPLANT
NDL SPNL 18GX3.5 QUINCKE PK (NEEDLE) ×1 IMPLANT
NEEDLE 22X1 1/2 (OR ONLY) (NEEDLE) ×2 IMPLANT
NEEDLE I-PASS III (NEEDLE) IMPLANT
NEEDLE SPNL 18GX3.5 QUINCKE PK (NEEDLE) ×2 IMPLANT
NS IRRIG 1000ML POUR BTL (IV SOLUTION) ×3 IMPLANT
PACK LAMINECTOMY ORTHO (CUSTOM PROCEDURE TRAY) ×2 IMPLANT
PACK UNIVERSAL I (CUSTOM PROCEDURE TRAY) ×2 IMPLANT
PAD ARMBOARD 7.5X6 YLW CONV (MISCELLANEOUS) ×4 IMPLANT
PROBE PULSE STIMULATION (NEUROSURGERY SUPPLIES) ×1 IMPLANT
PUTTY BONE DBX 5CC MIX (Putty) ×2 IMPLANT
PUTTY DBX 2.5CC (Putty) ×2 IMPLANT
PUTTY DBX 2.5CC DEPUY (Putty) IMPLANT
ROD RELINE MAS LORD 5.5X45MM (Rod) ×2 IMPLANT
SCREW LOCK RELINE 5.5 TULIP (Screw) ×4 IMPLANT
SCREW RELINE RED 6.5X45MM POLY (Screw) ×4 IMPLANT
SHIM DISC ALUMINUM (MISCELLANEOUS) ×1 IMPLANT
SHIM WIDENING (MISCELLANEOUS) ×3 IMPLANT
SPACER RISE-L 18X55 7-14MM (Spacer) ×1 IMPLANT
SPONGE INTESTINAL PEANUT (DISPOSABLE) ×2 IMPLANT
SPONGE SURGIFOAM ABS GEL 100 (HEMOSTASIS) ×1 IMPLANT
SPONGE T-LAP 18X18 ~~LOC~~+RFID (SPONGE) ×1 IMPLANT
SPONGE T-LAP 4X18 ~~LOC~~+RFID (SPONGE) ×3 IMPLANT
STRIP CLOSURE SKIN 1/2X4 (GAUZE/BANDAGES/DRESSINGS) IMPLANT
SURGIFLO W/THROMBIN 8M KIT (HEMOSTASIS) ×1 IMPLANT
SUT BONE WAX W31G (SUTURE) ×2 IMPLANT
SUT MNCRL AB 3-0 PS2 27 (SUTURE) ×4 IMPLANT
SUT PROLENE 5 0 C 1 24 (SUTURE) IMPLANT
SUT SILK 2 0 TIES 10X30 (SUTURE) ×2 IMPLANT
SUT SILK 3 0 TIES 10X30 (SUTURE) ×2 IMPLANT
SUT VIC AB 0 CT1 27 (SUTURE) ×2
SUT VIC AB 0 CT1 27XBRD ANBCTR (SUTURE) IMPLANT
SUT VIC AB 1 CT1 18XCR BRD 8 (SUTURE) IMPLANT
SUT VIC AB 1 CT1 27 (SUTURE)
SUT VIC AB 1 CT1 27XBRD ANBCTR (SUTURE) ×2 IMPLANT
SUT VIC AB 1 CT1 8-18 (SUTURE) ×2
SUT VIC AB 1 CTX 36 (SUTURE) ×4
SUT VIC AB 1 CTX36XBRD ANBCTR (SUTURE) ×2 IMPLANT
SUT VIC AB 2-0 CT1 18 (SUTURE) ×4 IMPLANT
SYR BULB IRRIG 60ML STRL (SYRINGE) ×2 IMPLANT
SYR CONTROL 10ML LL (SYRINGE) ×2 IMPLANT
TAPE CLOTH 4X10 WHT NS (GAUZE/BANDAGES/DRESSINGS) ×4 IMPLANT
TOWEL GREEN STERILE (TOWEL DISPOSABLE) ×3 IMPLANT
TOWEL GREEN STERILE FF (TOWEL DISPOSABLE) ×2 IMPLANT
TRAY FOLEY W/BAG SLVR 16FR (SET/KITS/TRAYS/PACK) ×2
TRAY FOLEY W/BAG SLVR 16FR ST (SET/KITS/TRAYS/PACK) ×1 IMPLANT
WATER STERILE IRR 1000ML POUR (IV SOLUTION) ×2 IMPLANT

## 2021-10-20 NOTE — Anesthesia Postprocedure Evaluation (Signed)
Anesthesia Post Note ? ?Patient: Stacey Poole ? ?Procedure(s) Performed: EXTREME LATERAL INTERBODY FUSION LUMBAR THREE THROUGH FOUR WITH POSTERIOR SPINAL FUSION INSTRUMENTATION (Spine Lumbar) ? ?  ? ?Patient location during evaluation: PACU ?Anesthesia Type: General ?Level of consciousness: patient cooperative, sedated and oriented ?Pain management: pain level controlled (much improved) ?Vital Signs Assessment: post-procedure vital signs reviewed and stable ?Respiratory status: spontaneous breathing, nonlabored ventilation and respiratory function stable ?Cardiovascular status: blood pressure returned to baseline and stable ?Postop Assessment: no apparent nausea or vomiting and able to ambulate ?Anesthetic complications: no ? ? ?No notable events documented. ? ?Last Vitals:  ?Vitals:  ? 10/20/21 1900 10/20/21 1915  ?BP: 134/67 (!) 143/80  ?Pulse: 71 (!) 55  ?Resp: 12 18  ?Temp:    ?SpO2: 95% 100%  ?  ?Last Pain:  ?Vitals:  ? 10/20/21 1900  ?TempSrc:   ?PainSc: Asleep  ? ? ?  ?  ?  ?  ?  ?  ? ?Barton Want,E. Garon Melander ? ? ? ? ?

## 2021-10-20 NOTE — Anesthesia Procedure Notes (Signed)
Procedure Name: Intubation ?Date/Time: 10/20/2021 2:28 PM ?Performed by: Cy Blamer, CRNA ?Pre-anesthesia Checklist: Patient identified, Emergency Drugs available, Suction available and Patient being monitored ?Patient Re-evaluated:Patient Re-evaluated prior to induction ?Oxygen Delivery Method: Circle system utilized ?Preoxygenation: Pre-oxygenation with 100% oxygen ?Induction Type: IV induction ?Laryngoscope Size: Hyacinth Meeker and 2 ?Grade View: Grade I ?Tube type: Oral ?Tube size: 7.0 mm ?Number of attempts: 1 ?Airway Equipment and Method: Stylet and Oral airway ?Placement Confirmation: ETT inserted through vocal cords under direct vision, positive ETCO2 and breath sounds checked- equal and bilateral ?Secured at: 21 cm ?Tube secured with: Tape ?Dental Injury: Teeth and Oropharynx as per pre-operative assessment  ?Comments: Preexisting teeth are decayed and loose; unchanged after intubation. ? ? ? ? ?

## 2021-10-20 NOTE — Brief Op Note (Signed)
10/20/2021 ? ?5:05 PM ? ?PATIENT:  Stacey Poole  71 y.o. female ? ?PRE-OPERATIVE DIAGNOSIS:  Lateral spinal stenosis with degenerative disc disease and neurogenic claudication ? 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 ?PROCEDURE:  Procedure(s) with comments: ?EXTREME LATERAL INTERBODY FUSION LUMBAR THREE THROUGH FOUR WITH POSTERIOR SPINAL FUSION INSTRUMENTATION (N/A) - 4.5 hrs ?Left tap block with exparel ?3 C-Bed ? ?SURGEON:  Surgeon(s) and Role: ?   Venita Lick, MD - Primary ? ?PHYSICIAN ASSISTANT:  ? ?ASSISTANTS: Voncille Lo, PA  ? ?ANESTHESIA:   general ? ?EBL:  100 mL  ? ?BLOOD ADMINISTERED:none ? ?DRAINS: none  ? ?LOCAL MEDICATIONS USED:  MARCAINE    ? ?SPECIMEN:  No Specimen ? ?DISPOSITION OF SPECIMEN:  N/A ? ?COUNTS:  YES ? ?TOURNIQUET:  * No tourniquets in log * ? ?DICTATION: .Dragon Dictation ? ?PLAN OF CARE: Admit to inpatient  ? ?PATIENT DISPOSITION:  PACU - hemodynamically stable. ?  ?

## 2021-10-20 NOTE — H&P (Signed)
Addendum H&P.  There has been no change in the patient's clinical exam or history since her last office visit of 10/14/2021.  She does report ongoing significant radicular leg pain and back pain which has impaired her quality of life.  As result of the failure of conservative management we have elected to move forward with an L3-4 XLIF to address the degenerative disc disease and stenosis.  I have again gone over the risks, benefits, and alternatives to surgery and all of her questions were encouraged and addressed. ?

## 2021-10-20 NOTE — Op Note (Signed)
OPERATIVE REPORT ? ?DATE OF SURGERY: 10/20/2021 ? ?PATIENT NAME:  Stacey Poole ?MRN: [id number] ?DOB: [birth year] ? ?PCP: Garwin Brothers, MD ? 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 ?POST-OPERATIVE DIAGNOSIS: Same ? ?PROCEDURE:   ?XLIF L3-4 with posterior pedicle screw fixation ? ?SURGEON:  Melina Schools, MD ? ?PHYSICIAN ASSISTANT: Nelson Chimes, PA ? ?ANESTHESIA:   General ? ?EBL: 100 ml  ? ?Complications: None ? ?Implants: Globus Rise-L expandable XLIF cage.  18 mm x 55 mm.  7-14 expandable 0 degree lordosis.  Cage expanded to a final height of approximately 12 mm ?NuVasive MIS pedicle screws.  6.5 x 45 mm length screws.  45 mm length rod. ? ?Graft: DBX mix/DBX putty ? ?Neuromonitoring: No adverse free running EMG or SSEP activity.  All 4 pedicle screws were directly stimulated and there was no adverse activity greater than 40 mA. ? ?BRIEF HISTORY: ?Stacey Poole is a 71 y.o. female who presented to my care with significant back buttock and neuropathic leg pain.  Attempts at conservative management had failed to alleviate her pain and improve her quality of life.  Patient had degenerative lumbar disc disease as well as degenerative spinal stenosis.  As result of her severe pain we elected to move forward with a lateral interbody fusion with posterior pedicle screw fixation.  All appropriate risks, benefits, and alternatives were discussed with the patient and consent was obtained. ? ?PROCEDURE DETAILS: ?Patient was brought into the operating room and was properly positioned on the operating room table.  After induction with general anesthesia the patient was endotracheally intubated.  A timeout was taken to confirm all important data: including patient, procedure, and the level. Teds, SCD's were applied.  ? ?Patient was turned into the lateral decubitus position left side up.  Axillary roll was placed and all bony prominences  were well-padded.  Using imaging I confirmed I had properly positioned the L3-4 disc space.  Patient was secured directly to the OR table with tape to prevent her from moving during the procedure.  The abdomen and posterior lumbar spine and flank were prepped and draped in a standard fashion. ? ?Using fluoroscopy I marked out the anterior and posterior margins of the L3-4 disc and infiltrated the incision site with quarter percent Marcaine with epinephrine.  A lateral incision was made and sharp dissection was carried out down to the fascia of the external oblique.  A second small incision was made 1 fingerbreadth posteriorly and I bluntly dissected down to the posterior aspect of the retroperitoneal fascia I advanced bluntly into the retroperitoneal space and mobilized the adipose tissue.  I then dissected through the undersurface of the external and internal oblique until I could see my finger in the lateral incision.  I then placed the first dilating tube through the lateral incision down to the surface of the psoas.  I stimulated the surface of the psoas to confirm I was not traumatizing the plexus and then advanced through the psoas down to the lateral aspect of the disc space.  I then stimulated circumferentially to ensure the plexus not being traumatized.  I then placed the next dilating tube and again stimulated circumferentially.  There was no adverse activity on the free running EMGs.  I then placed the retracting device and remove the first 2 dilating tubes.  The retracting device was then secured to the table with the strong arm.  I could now see the lateral disc space/annulus.  I then stimulated directly behind each of the blades to ensure that plexus was not being traumatized.  I then gently repositioned the blades until I had maximally exposed the lateral aspect of the disc space.  Using fluoroscopy I confirmed satisfactory positioning of my retractor and exposure of the disc.  The posterior blade was  then advanced into the disc space itself.  I confirmed satisfactory position of the shim and the retractor in both planes. ? ?Annulotomy was performed with a 10 blade scalpel and then I used a Cobb elevator to release the disc cartilaginous endplate and the contralateral annulus.  I did this along both endplates.  I then used a box osteotome to remove the bulk of the disc material.  At this point I used pituitary rongeurs and curettes to remove the remaining portion of disc and expose the bleeding subchondral bone.  Once I had removed all of the disc material and I released the contralateral annulus I then placed my trial implant.  I expanded this up to about 11 mm and had excellent overall purchase.  I then obtained the actual implant and packed it with the allograft.  I then gently inserted the expandable cage across the disc space.  I confirmed satisfactory position in both planes and then expanded the cage.  The final height was approximately 12 mm.  I then backfilled the cage with allograft.  At this point I irrigated the wound copiously normal saline and made sure I had hemostasis using proper electrocautery and Floseal.  The wound was then copiously irrigated with normal saline and then removed the retractor.  I then closed the fascia of the external oblique with a running #1 Vicryl.  At this point a final AP and lateral fluoroscopy view was taken and there was no retained surgical instruments in the field.  The wound was then closed in a layered fashion with a running 0 Vicryl suture, interrupted 2-0 Vicryl suture, and a 3-0 Monocryl for the skin.  At this point I then turned my attention to the pedicle screws.  ? ?Using the AP view I identified the lateral border of the L3 and L4 pedicle.  I then made small stab incisions and advanced the Jamshidi needle down to the lateral aspect of the pedicle.  Using AP fluoroscopy and direct neural stimulation of the Jamshidi needle I advanced a Jamshidi needle into the  pedicle.  As I was nearing the medial wall of the pedicle I switched the fluoroscopy to the lateral view.  I confirmed that I was just beyond the posterior wall of the vertebral body in this view.  Having confirmed the trajectory and position I advanced into the vertebral body.  I then placed the guidepin through the center of the Jamshidi needle and removed it.  I repeated the exact same technique at each of the other pedicles until all 4 pedicles were cannulated.  Once all 4 guidepins were placed I took an AP and lateral fluoroscopy view to ensure that the guidepins were properly positioned.  Once confirmed I then measured and placed the 45 mm length screw over the guidepin.  I advanced the screw over the guidepin until was properly seated and then I removed the guidepin.  All 4 pedicle screws were placed over the guidepin without issue.  All 4 pedicle screws were then directly stimulated and there was no adverse activity greater than 40 mA.  I then measured and placed the 45 mm rod and secured it with a  locking caps.  The locking caps were then tightened/torqued according manufacture standards.  The insertion tabs from the pedicle screws were then removed.  The remaining wounds were then irrigated copiously with normal saline and closed in a layered fashion with interrupted #1 Vicryl suture, 2-0 Vicryl suture, and a 3-0 Monocryl.  Final intraoperative AP and lateral fluoroscopy views demonstrated satisfactory positioning of the intervertebral cage and pedicle screw construct.  Dry dressings were applied and the patient was ultimately extubated and transferred to the PACU without incident. ? ?Melina Schools, MD ?10/20/2021 ?4:50 PM ? ? ?

## 2021-10-20 NOTE — Discharge Instructions (Signed)

## 2021-10-20 NOTE — Transfer of Care (Signed)
Immediate Anesthesia Transfer of Care Note ? ?Patient: Stacey Poole ? ?Procedure(s) Performed: EXTREME LATERAL INTERBODY FUSION LUMBAR THREE THROUGH FOUR WITH POSTERIOR SPINAL FUSION INSTRUMENTATION (Spine Lumbar) ? ?Patient Location: PACU ? ?Anesthesia Type:General ? ?Level of Consciousness: awake, alert  and oriented ? ?Airway & Oxygen Therapy: Patient Spontanous Breathing and Patient connected to face mask oxygen ? ?Post-op Assessment: Report given to RN, Post -op Vital signs reviewed and stable, Patient moving all extremities X 4 and Patient able to stick tongue midline ? ?Post vital signs: Reviewed ? ?Last Vitals:  ?Vitals Value Taken Time  ?BP 144/95 10/20/21 1747  ?Temp 98.0   ?Pulse 72 10/20/21 1748  ?Resp 15 10/20/21 1749  ?SpO2 95 % 10/20/21 1748  ?Vitals shown include unvalidated device data. ? ?Last Pain:  ?Vitals:  ? 10/20/21 1115  ?TempSrc:   ?PainSc: 0-No pain  ?   ? ?Patients Stated Pain Goal: 3 (10/20/21 1019) ? ?Complications: No notable events documented. ?

## 2021-10-20 NOTE — Anesthesia Procedure Notes (Signed)
Anesthesia Regional Block: TAP block  ? ?Pre-Anesthetic Checklist: , timeout performed,  Correct Patient, Correct Site, Correct Laterality,  Correct Procedure,, site marked,  Risks and benefits discussed,  At surgeon's request ? ?Laterality: Left and Lower ? ?Prep: Maximum Sterile Barrier Precautions used, chloraprep     ?  ?Needles:  ?Injection technique: Single-shot ? ?Needle Type: Echogenic Needle   ? ? ?Needle Length: 4cm  ?Needle Gauge: 20  ? ? ? ?Additional Needles: ? ? ?Procedures:,,,, ultrasound used (permanent image in chart),,    ?Narrative:  ?Start time: 10/20/2021 10:58 AM ?End time: 10/20/2021 11:07 AM ? ?Performed by: Personally  ?Anesthesiologist: Barnet Glasgow, MD ? ?Additional Notes: ?Pr tolerated procedure well ? ? ? ? ?

## 2021-10-21 ENCOUNTER — Encounter (HOSPITAL_COMMUNITY): Payer: Self-pay | Admitting: Orthopedic Surgery

## 2021-10-21 MED ORDER — SORBITOL 70 % SOLN
30.0000 mL | Freq: Every day | Status: DC | PRN
  Administered 2021-10-21: 30 mL via ORAL
  Filled 2021-10-21: qty 30

## 2021-10-21 NOTE — Progress Notes (Signed)
Patient awaiting transport via wheelchair by volunteer for discharge home; in no acute distress nor complaints of pain nor discomfort; incision on her left flank with honeycomb dressing and is clean, dry and intact; room was checked and accounted for all her belongings; discharge instructions concerning her medications, incision care, follow up appointment and when to call the doctor as needed were all discussed with patient by RN and she expressed understanding on the instructions given. ?

## 2021-10-21 NOTE — Progress Notes (Signed)
Subjective: ?1 Day Post-Op Procedure(s) (LRB): ?EXTREME LATERAL INTERBODY FUSION LUMBAR THREE THROUGH FOUR WITH POSTERIOR SPINAL FUSION INSTRUMENTATION (N/A) ?Patient reports pain as mild.  Leg pain improved. ?+void, +flatus ?+ambulation ?Tolerating PO without N/V. ?Denies calf pain, CP, SOB ? ? ?Objective: ?Vital signs in last 24 hours: ?Temp:  [97.5 ?F (36.4 ?C)-98.8 ?F (37.1 ?C)] 98.8 ?F (37.1 ?C) (03/03 0358) ?Pulse Rate:  [50-78] 74 (03/03 0358) ?Resp:  [12-22] 18 (03/03 0358) ?BP: (117-213)/(65-99) 149/90 (03/03 0358) ?SpO2:  [92 %-100 %] 94 % (03/03 0358) ?Weight:  [91.7 kg] 91.7 kg (03/02 1011) ? ?Intake/Output from previous day: ?03/02 0701 - 03/03 0700 ?In: 1900 [I.V.:1900] ?Out: 1525 [Urine:1425; Blood:100] ?Intake/Output this shift: ?No intake/output data recorded. ? ?No results for input(s): HGB in the last 72 hours. ?No results for input(s): WBC, RBC, HCT, PLT in the last 72 hours. ?No results for input(s): NA, K, CL, CO2, BUN, CREATININE, GLUCOSE, CALCIUM in the last 72 hours. ?No results for input(s): LABPT, INR in the last 72 hours. ? ?Neurologically intact ?ABD soft ?Neurovascular intact ?Sensation intact distally ?Intact pulses distally ?Dorsiflexion/Plantar flexion intact ?Incision: dressing C/D/I ?No cellulitis present ?Compartment soft ? ? ?Assessment/Plan: ?1 Day Post-Op Procedure(s) (LRB): ?EXTREME LATERAL INTERBODY FUSION LUMBAR THREE THROUGH FOUR WITH POSTERIOR SPINAL FUSION INSTRUMENTATION (N/A) ?Advance diet ?Up with therapy ?LSO when OOB ?DVT: Teds, SCDs, ambulation ?IS encouraged ? ?Plan D/C today. F/u With Dr. Shon Baton in 2 weeks ? ? ? ?Stacey Poole ?10/21/2021, 7:34 AM ? ?

## 2021-10-21 NOTE — Evaluation (Signed)
Occupational Therapy Evaluation ?Patient Details ?Name: Stacey Poole ?MRN: [id number] ?DOB: 12/29/1950 ?Today's Date: 10/21/2021 ? ? ?History of Present Illness 70 y/o female admitted on 10/20/21 following extreme lateral interbody fusion L3-4 with posterior fusion. No significant PMH.  ? ?Clinical Impression ?  ?PTA, pt was living alone and was independent. Currently, pt requires Min Guard A for LB ADLs with AE and functional mobility. Provided education and handout on back precautions, grooming, bed mobility, LB ADLs, toileting, and tub transfer with shower seat; pt demonstrated understanding. Answered all pt questions. Recommend dc home once medically stable per physician. All acute OT needs met and will sign off. Thank you.  ?   ? ?Recommendations for follow up therapy are one component of a multi-disciplinary discharge planning process, led by the attending physician.  Recommendations may be updated based on patient status, additional functional criteria and insurance authorization.  ? ?Follow Up Recommendations ? No OT follow up  ?  ?Assistance Recommended at Discharge PRN  ?Patient can return home with the following   ? ?  ?Functional Status Assessment ? Patient has had a recent decline in their functional status and demonstrates the ability to make significant improvements in function in a reasonable and predictable amount of time.  ?Equipment Recommendations ? None recommended by OT  ?  ?Recommendations for Other Services   ? ? ?  ?Precautions / Restrictions Precautions ?Precautions: Back ?Restrictions ?Weight Bearing Restrictions: No  ? ?  ? ?Mobility Bed Mobility ?Overal bed mobility: Needs Assistance ?Bed Mobility: Rolling, Sidelying to Sit, Sit to Sidelying ?Rolling: Min guard ?Sidelying to sit: Min guard ?  ?  ?Sit to sidelying: Min guard ?General bed mobility comments: Min Guard A for safety during log roll ?  ? ?Transfers ?Overall transfer level: Needs assistance ?Equipment used: None ?Transfers: Sit  to/from Stand ?Sit to Stand: Supervision ?  ?  ?  ?  ?  ?  ?  ? ?  ?Balance Overall balance assessment: Mild deficits observed, not formally tested ?  ?  ?  ?  ?  ?  ?  ?  ?  ?  ?  ?  ?  ?  ?  ?  ?  ?  ?   ? ?ADL either performed or assessed with clinical judgement  ? ?ADL Overall ADL's : Needs assistance/impaired ?Eating/Feeding: Supervision/ safety;Set up;Sitting ?  ?Grooming: Set up;Supervision/safety;Standing ?  ?Upper Body Bathing: Supervision/ safety;Set up;Sitting ?  ?Lower Body Bathing: Min guard;Sit to/from stand ?  ?Upper Body Dressing : Supervision/safety;Set up;Sitting ?  ?Lower Body Dressing: Min guard;Sit to/from stand;With adaptive equipment ?Lower Body Dressing Details (indicate cue type and reason): Education on use of shoe horn and sock aide ?Toilet Transfer: Min guard;Ambulation ?  ?Toileting- Clothing Manipulation and Hygiene: Min guard;Sit to/from stand ?Toileting - Clothing Manipulation Details (indicate cue type and reason): Education ?Tub/ Shower Transfer: Min guard;Ambulation;Tub transfer ?  ?Functional mobility during ADLs: Min guard ?General ADL Comments: Providing education on back precautions, bed mobility, brace management, grooming, LB ADLs, toileting, and tub transfer.  ? ? ? ?Vision   ?   ?   ?Perception   ?  ?Praxis   ?  ? ?Pertinent Vitals/Pain    ? ? ? ?Hand Dominance   ?  ?Extremity/Trunk Assessment Upper Extremity Assessment ?Upper Extremity Assessment: Overall WFL for tasks assessed ?  ?Lower Extremity Assessment ?Lower Extremity Assessment: Defer to PT evaluation;LLE deficits/detail ?LLE Deficits / Details: Limited LLE ROM due to pain ?  ?  Cervical / Trunk Assessment ?Cervical / Trunk Assessment: Back Surgery ?  ?Communication Communication ?Communication: No difficulties ?  ?Cognition Arousal/Alertness: Awake/alert ?Behavior During Therapy: WFL for tasks assessed/performed ?Overall Cognitive Status: Within Functional Limits for tasks assessed ?  ?  ?  ?  ?  ?  ?  ?  ?  ?  ?  ?   ?  ?  ?  ?  ?  ?  ?  ?General Comments    ? ?  ?Exercises   ?  ?Shoulder Instructions    ? ? ?Home Living Family/patient expects to be discharged to:: Private residence ?Living Arrangements: Alone ?Available Help at Discharge: Family;Available PRN/intermittently ?Type of Home: House ?Home Access: Level entry ?  ?  ?Home Layout: One level ?  ?  ?Bathroom Shower/Tub: Tub/shower unit ?  ?Bathroom Toilet: Standard ?  ?  ?Home Equipment: None ?  ?  ?  ? ?  ?Prior Functioning/Environment Prior Level of Function : Independent/Modified Independent ?  ?  ?  ?  ?  ?  ?  ?  ?  ? ?  ?  ?OT Problem List: Decreased strength;Decreased range of motion;Decreased activity tolerance;Impaired balance (sitting and/or standing);Decreased knowledge of use of DME or AE;Decreased knowledge of precautions ?  ?   ?OT Treatment/Interventions:    ?  ?OT Goals(Current goals can be found in the care plan section) Acute Rehab OT Goals ?Patient Stated Goal: Go home ?OT Goal Formulation: All assessment and education complete, DC therapy  ?OT Frequency:   ?  ? ?Co-evaluation   ?  ?  ?  ?  ? ?  ?AM-PAC OT "6 Clicks" Daily Activity     ?Outcome Measure Help from another person eating meals?: None ?Help from another person taking care of personal grooming?: A Little ?Help from another person toileting, which includes using toliet, bedpan, or urinal?: A Little ?Help from another person bathing (including washing, rinsing, drying)?: A Little ?Help from another person to put on and taking off regular upper body clothing?: None ?Help from another person to put on and taking off regular lower body clothing?: A Little ?6 Click Score: 20 ?  ?End of Session Nurse Communication: Mobility status ? ?Activity Tolerance: Patient tolerated treatment well ?Patient left: in bed;with call bell/phone within reach ? ?OT Visit Diagnosis: Unsteadiness on feet (R26.81);Other abnormalities of gait and mobility (R26.89);Muscle weakness (generalized) (M62.81)  ?               ?Time: 0815-0838 ?OT Time Calculation (min): 23 min ?Charges:  OT General Charges ?$OT Visit: 1 Visit ?OT Evaluation ?$OT Eval Low Complexity: 1 Low ?OT Treatments ?$Self Care/Home Management : 8-22 mins ? ?Charis Capehart MSOT, OTR/L ?Acute Rehab ?Pager: [phone] ?Office: [phone] ? ?Charis M Capehart ?10/21/2021, 9:21 AM ?

## 2021-10-21 NOTE — Plan of Care (Signed)
?  Problem: Education: Goal: Ability to verbalize activity precautions or restrictions will improve Outcome: Completed/Met Goal: Knowledge of the prescribed therapeutic regimen will improve Outcome: Completed/Met Goal: Understanding of discharge needs will improve Outcome: Completed/Met   Problem: Activity: Goal: Ability to avoid complications of mobility impairment will improve Outcome: Completed/Met Goal: Ability to tolerate increased activity will improve Outcome: Completed/Met Goal: Will remain free from falls Outcome: Completed/Met   Problem: Bowel/Gastric: Goal: Gastrointestinal status for postoperative course will improve Outcome: Completed/Met   Problem: Clinical Measurements: Goal: Ability to maintain clinical measurements within normal limits will improve Outcome: Completed/Met Goal: Postoperative complications will be avoided or minimized Outcome: Completed/Met Goal: Diagnostic test results will improve Outcome: Completed/Met   Problem: Pain Management: Goal: Pain level will decrease Outcome: Completed/Met   Problem: Skin Integrity: Goal: Will show signs of wound healing Outcome: Completed/Met   Problem: Health Behavior/Discharge Planning: Goal: Identification of resources available to assist in meeting health care needs will improve Outcome: Completed/Met   Problem: Bladder/Genitourinary: Goal: Urinary functional status for postoperative course will improve Outcome: Completed/Met   

## 2021-10-21 NOTE — Evaluation (Signed)
Physical Therapy Evaluation & Discharge ?Patient Details ?Name: Stacey Poole ?MRN: [id number] ?DOB: 24-Sep-1950 ?Today's Date: 10/21/2021 ? ?History of Present Illness ? 71 y/o female admitted on 10/20/21 following extreme lateral interbody fusion L3-4 with posterior fusion. No significant PMH.  ?Clinical Impression ? Patient admitted with above. Patient functioning at modI level for mobility with no AD. Patient does demo decreased activity tolerance requiring standing rest break. Educated patient on back precautions, brace wear, and progressive walking program, patient demonstrated understanding. No further skilled PT required acutely. No PT follow up recommended at this time.  ?   ? ?Recommendations for follow up therapy are one component of a multi-disciplinary discharge planning process, led by the attending physician.  Recommendations may be updated based on patient status, additional functional criteria and insurance authorization. ? ?Follow Up Recommendations No PT follow up ? ?  ?Assistance Recommended at Discharge PRN  ?Patient can return home with the following ?   ? ?  ?Equipment Recommendations None recommended by PT  ?Recommendations for Other Services ?    ?  ?Functional Status Assessment Patient has had a recent decline in their functional status and demonstrates the ability to make significant improvements in function in a reasonable and predictable amount of time.  ? ?  ?Precautions / Restrictions Precautions ?Precautions: Back ?Precaution Booklet Issued: Yes (comment) ?Required Braces or Orthoses: Spinal Brace ?Spinal Brace: Lumbar corset;Applied in sitting position ?Restrictions ?Weight Bearing Restrictions: No  ? ?  ? ?Mobility ? Bed Mobility ?Overal bed mobility: Modified Independent ?Bed Mobility: Rolling, Sidelying to Sit, Sit to Sidelying ?  ?  ?  ?  ?  ?  ?  ? ?Transfers ?Overall transfer level: Modified independent ?Equipment used: None ?  ?  ?  ?  ?  ?  ?  ?  ?   ? ?Ambulation/Gait ?Ambulation/Gait assistance: Modified independent (Device/Increase time) ?Gait Distance (Feet): 300 Feet ?Assistive device: None ?Gait Pattern/deviations: Step-through pattern, Decreased stride length ?Gait velocity: decreased ?  ?  ?General Gait Details: slow steady gait pattern but no LOB noted. Standing rest break x 1 ? ?Stairs ?  ?  ?  ?  ?  ? ?Wheelchair Mobility ?  ? ?Modified Rankin (Stroke Patients Only) ?  ? ?  ? ?Balance Overall balance assessment: Mild deficits observed, not formally tested ?  ?  ?  ?  ?  ?  ?  ?  ?  ?  ?  ?  ?  ?  ?  ?  ?  ?  ?   ? ? ? ?Pertinent Vitals/Pain    ? ? ?Home Living Family/patient expects to be discharged to:: Private residence ?Living Arrangements: Alone ?Available Help at Discharge: Family;Available PRN/intermittently ?Type of Home: House ?Home Access: Level entry ?  ?  ?  ?Home Layout: One level ?Home Equipment: None ?   ?  ?Prior Function Prior Level of Function : Independent/Modified Independent ?  ?  ?  ?  ?  ?  ?  ?  ?  ? ? ?Hand Dominance  ?   ? ?  ?Extremity/Trunk Assessment  ? Upper Extremity Assessment ?Upper Extremity Assessment: Defer to OT evaluation ?  ? ?Lower Extremity Assessment ?Lower Extremity Assessment: LLE deficits/detail ?LLE Deficits / Details: Limited LLE ROM due to pain ?  ? ?Cervical / Trunk Assessment ?Cervical / Trunk Assessment: Back Surgery  ?Communication  ? Communication: No difficulties  ?Cognition Arousal/Alertness: Awake/alert ?Behavior During Therapy: [facility] for tasks assessed/performed ?Overall Cognitive Status:  Within Functional Limits for tasks assessed ?  ?  ?  ?  ?  ?  ?  ?  ?  ?  ?  ?  ?  ?  ?  ?  ?  ?  ?  ? ?  ?General Comments   ? ?  ?Exercises    ? ?Assessment/Plan  ?  ?PT Assessment Patient does not need any further PT services  ?PT Problem List   ? ?   ?  ?PT Treatment Interventions     ? ?PT Goals (Current goals can be found in the Care Plan section)  ?Acute Rehab PT Goals ?Patient Stated Goal: to go home ?PT  Goal Formulation: All assessment and education complete, DC therapy ? ?  ?Frequency   ?  ? ? ?Co-evaluation   ?  ?  ?  ?  ? ? ?  ?AM-PAC PT "6 Clicks" Mobility  ?Outcome Measure Help needed turning from your back to your side while in a flat bed without using bedrails?: None ?Help needed moving from lying on your back to sitting on the side of a flat bed without using bedrails?: None ?Help needed moving to and from a bed to a chair (including a wheelchair)?: None ?Help needed standing up from a chair using your arms (e.g., wheelchair or bedside chair)?: None ?Help needed to walk in hospital room?: None ?Help needed climbing 3-5 steps with a railing? : None ?6 Click Score: 24 ? ?  ?End of Session Equipment Utilized During Treatment: Back brace ?Activity Tolerance: Patient tolerated treatment well ?Patient left: in bed;with call bell/phone within reach ?Nurse Communication: Mobility status ?PT Visit Diagnosis: Muscle weakness (generalized) (M62.81) ?  ? ?Time: [id number] ?PT Time Calculation (min) (ACUTE ONLY): 16 min ? ? ?Charges:   PT Evaluation ?$PT Eval Low Complexity: 1 Low ?  ?  ?   ? ? ?Phoua Hoadley A. Dan Humphreys, PT, DPT ?Acute Rehabilitation Services ?Pager [phone] ?Office [phone] ? ? ?Sreeja Spies A Shereen Marton ?10/21/2021, 9:42 AM ? ?

## 2021-10-24 NOTE — Discharge Summary (Signed)
? ?Patient ID: ?Stacey Poole ?MRN: [id number] ?DOB/AGE: Jul 01, 1951 71 y.o. ? ?Admit date: 10/20/2021 ?Discharge date: 10/21/2021 ? ?Admission Diagnoses:  ?Principal Problem: ?  S/P lumbar fusion ? ? ?Discharge Diagnoses:  ?Principal Problem: ?  S/P lumbar fusion ? status post Procedure(s): ?EXTREME LATERAL INTERBODY FUSION LUMBAR THREE THROUGH FOUR WITH POSTERIOR SPINAL FUSION INSTRUMENTATION ? ?Past Medical History:  ?Diagnosis Date  ? Complication of anesthesia   ? difficulty waking up after anesthesia  ? Dyspnea   ? Pneumonia   ? ? ?Surgeries: Procedure(s): ?EXTREME LATERAL INTERBODY FUSION LUMBAR THREE THROUGH FOUR WITH POSTERIOR SPINAL FUSION INSTRUMENTATION on 10/20/2021 ?  ?Consultants:  ? ?Discharged Condition: Improved ? ?Hospital Course: Stacey Poole is an 71 y.o. female who was admitted 10/20/2021 for operative treatment of S/P lumbar fusion. Patient failed conservative treatments (please see the history and physical for the specifics) and had severe unremitting pain that affects sleep, daily activities and work/hobbies. After pre-op clearance, the patient was taken to the operating room on 10/20/2021 and underwent  Procedure(s): ?EXTREME LATERAL INTERBODY FUSION LUMBAR THREE THROUGH FOUR WITH POSTERIOR SPINAL FUSION INSTRUMENTATION.   ? ?Patient was given perioperative antibiotics:  ?Anti-infectives (From admission, onward)  ? ? Start     Dose/Rate Route Frequency Ordered Stop  ? 10/20/21 2045  ceFAZolin (ANCEF) IVPB 1 g/50 mL premix       ? 1 g ?100 mL/hr over 30 Minutes Intravenous Every 8 hours 10/20/21 1949 10/21/21 0333  ? 10/20/21 0757  ceFAZolin (ANCEF) IVPB 2g/100 mL premix       ? 2 g ?200 mL/hr over 30 Minutes Intravenous 30 min pre-op 10/20/21 0757 10/20/21 1430  ? ?  ?  ? ?Patient was given sequential compression devices and early ambulation to prevent DVT.  ? ?Patient benefited maximally from hospital stay and there were no complications. At the time of discharge, the patient was urinating/moving  their bowels without difficulty, tolerating a regular diet, pain is controlled with oral pain medications and they have been cleared by PT/OT.  ? ?Recent vital signs: No data found.  ? ?Recent laboratory studies: No results for input(s): WBC, HGB, HCT, PLT, NA, K, CL, CO2, BUN, CREATININE, GLUCOSE, INR, CALCIUM in the last 72 hours. ? ?Invalid input(s): PT, 2 ? ? ?Discharge Medications:   ?Allergies as of 10/21/2021   ?No Known Allergies ?  ? ?  ?Medication List  ?  ? ?STOP taking these medications   ? ?ibuprofen 800 MG tablet ?Commonly known as: ADVIL ?  ? ?  ? ?TAKE these medications   ? ?methocarbamol 500 MG tablet ?Commonly known as: Robaxin ?Take 1 tablet (500 mg total) by mouth every 8 (eight) hours as needed for up to 5 days for muscle spasms. ?  ?ondansetron 4 MG tablet ?Commonly known as: Zofran ?Take 1 tablet (4 mg total) by mouth every 8 (eight) hours as needed for nausea or vomiting. ?  ?oxyCODONE-acetaminophen 10-325 MG tablet ?Commonly known as: Percocet ?Take 1 tablet by mouth every 6 (six) hours as needed for up to 5 days for pain. ?  ? ?  ? ? ?Diagnostic Studies: DG Lumbar Spine 2-3 Views ? ?Result Date: 10/20/2021 ?CLINICAL DATA:  L3-L4 interbody fusion EXAM: LUMBAR SPINE - 2-3 VIEW COMPARISON:  09/09/2021 FINDINGS: Four fluoroscopic images are obtained during the performance of the procedure and are provided for interpretation only. Images demonstrate L3-4 discectomy and posterior fusion, with anatomic alignment. Please refer to the operative report. Fluoroscopy time: 3 minutes 44  seconds, 172.48 mGy IMPRESSION: 1. L3-4 discectomy and posterior fusion. Electronically Signed   By: Randa Ngo M.D.   On: 10/20/2021 17:09  ? ?DG C-Arm 1-60 Min-No Report ? ?Result Date: 10/20/2021 ?Fluoroscopy was utilized by the requesting physician.  No radiographic interpretation.  ? ?DG C-Arm 1-60 Min-No Report ? ?Result Date: 10/20/2021 ?Fluoroscopy was utilized by the requesting physician.  No radiographic  interpretation.  ? ?DG C-Arm 1-60 Min-No Report ? ?Result Date: 10/20/2021 ?Fluoroscopy was utilized by the requesting physician.  No radiographic interpretation.   ? ?Discharge Instructions   ? ? Incentive spirometry RT   Complete by: As directed ?  ? ?  ? ? ? Follow-up Information   ? ? Melina Schools, MD. Schedule an appointment as soon as possible for a visit in 2 week(s).   ?Specialty: Orthopedic Surgery ?Why: If symptoms worsen, For suture removal, For wound re-check ?Contact information: ?[address] ?[address] ?[place] [address] ?[phone] ? ? ?  ?  ? ?  ?  ? ?  ? ? ?Discharge Plan:  discharge to home ? ?Disposition: stable ? ? ? ?Signed: ?Charlyne Petrin for [facility] PA-C ?[facility] [phone]) [phone] ?10/24/2021, 8:21 AM  ?

## 2021-10-25 ENCOUNTER — Encounter (HOSPITAL_COMMUNITY): Payer: Self-pay | Admitting: Orthopedic Surgery

## 2021-12-02 DIAGNOSIS — M545 Low back pain, unspecified: Secondary | ICD-10-CM | POA: Diagnosis not present

## 2021-12-08 DIAGNOSIS — M545 Low back pain, unspecified: Secondary | ICD-10-CM | POA: Diagnosis not present

## 2021-12-08 DIAGNOSIS — R531 Weakness: Secondary | ICD-10-CM | POA: Diagnosis not present

## 2021-12-14 DIAGNOSIS — R531 Weakness: Secondary | ICD-10-CM | POA: Diagnosis not present

## 2021-12-14 DIAGNOSIS — M545 Low back pain, unspecified: Secondary | ICD-10-CM | POA: Diagnosis not present

## 2021-12-16 DIAGNOSIS — M545 Low back pain, unspecified: Secondary | ICD-10-CM | POA: Diagnosis not present

## 2021-12-16 DIAGNOSIS — R531 Weakness: Secondary | ICD-10-CM | POA: Diagnosis not present

## 2021-12-20 DIAGNOSIS — M545 Low back pain, unspecified: Secondary | ICD-10-CM | POA: Diagnosis not present

## 2021-12-20 DIAGNOSIS — R531 Weakness: Secondary | ICD-10-CM | POA: Diagnosis not present

## 2021-12-28 DIAGNOSIS — E039 Hypothyroidism, unspecified: Secondary | ICD-10-CM | POA: Diagnosis not present

## 2021-12-28 DIAGNOSIS — M545 Low back pain, unspecified: Secondary | ICD-10-CM | POA: Diagnosis not present

## 2021-12-30 DIAGNOSIS — Z4889 Encounter for other specified surgical aftercare: Secondary | ICD-10-CM | POA: Diagnosis not present

## 2022-02-15 DIAGNOSIS — E039 Hypothyroidism, unspecified: Secondary | ICD-10-CM | POA: Diagnosis not present

## 2022-03-01 DIAGNOSIS — M545 Low back pain, unspecified: Secondary | ICD-10-CM | POA: Diagnosis not present

## 2022-03-31 DIAGNOSIS — E039 Hypothyroidism, unspecified: Secondary | ICD-10-CM | POA: Diagnosis not present

## 2022-03-31 DIAGNOSIS — J449 Chronic obstructive pulmonary disease, unspecified: Secondary | ICD-10-CM | POA: Diagnosis not present

## 2022-04-27 DIAGNOSIS — H43811 Vitreous degeneration, right eye: Secondary | ICD-10-CM | POA: Diagnosis not present

## 2022-05-03 DIAGNOSIS — Z4889 Encounter for other specified surgical aftercare: Secondary | ICD-10-CM | POA: Diagnosis not present

## 2022-06-26 ENCOUNTER — Encounter: Payer: Self-pay | Admitting: Internal Medicine

## 2022-06-26 ENCOUNTER — Ambulatory Visit (INDEPENDENT_AMBULATORY_CARE_PROVIDER_SITE_OTHER): Payer: Medicare Other | Admitting: Internal Medicine

## 2022-06-26 VITALS — BP 128/86 | HR 93 | Temp 97.8°F | Resp 18 | Ht 61.0 in | Wt 187.0 lb

## 2022-06-26 DIAGNOSIS — E785 Hyperlipidemia, unspecified: Secondary | ICD-10-CM

## 2022-06-26 DIAGNOSIS — E039 Hypothyroidism, unspecified: Secondary | ICD-10-CM

## 2022-06-26 DIAGNOSIS — J4489 Other specified chronic obstructive pulmonary disease: Secondary | ICD-10-CM

## 2022-06-26 DIAGNOSIS — J410 Simple chronic bronchitis: Secondary | ICD-10-CM | POA: Diagnosis not present

## 2022-06-26 DIAGNOSIS — J449 Chronic obstructive pulmonary disease, unspecified: Secondary | ICD-10-CM | POA: Insufficient documentation

## 2022-06-26 HISTORY — DX: Hypothyroidism, unspecified: E03.9

## 2022-06-26 NOTE — Progress Notes (Unsigned)
Established Patient Office Visit  Subjective   Patient ID: ANARIA KRONER, female    DOB: Dec 01, 1950  Age: 71 y.o. MRN: [id number]  Chief Complaint  Patient presents with   Follow-up   HPI  71 years old female is here for follow up. She has back surgery few months ago and was given injection twice after that and her pain is better but is still 4/10 in intensity. She also has hypothyroidism and she is asymptomatic. She is due for TSH level drawn. She denies any edema, heat intolerance or fatigue.   She also has COPD and she says she does not need to use any inhalor. No wheeze.        Review of Systems  Constitutional:  Negative for chills and fever.  Cardiovascular:  Negative for chest pain and leg swelling.  Gastrointestinal:  Negative for abdominal pain, constipation and diarrhea.      Objective:     BP 128/86 (BP Location: Right Arm, Patient Position: Sitting, Cuff Size: Normal)   Pulse 93   Temp 97.8 F (36.6 C) (Temporal)   Resp 18   Ht _0  (1.549 m)   Wt 187 lb (84.8 kg)   SpO2 97%   BMI 35.33 kg/m    Physical Exam Constitutional:      Appearance: Normal appearance.  HENT:     Head: Normocephalic and atraumatic.  Eyes:     Extraocular Movements: Extraocular movements intact.     Pupils: Pupils are equal, round, and reactive to light.  Cardiovascular:     Rate and Rhythm: Normal rate and regular rhythm.     Pulses: Normal pulses.     Heart sounds: Normal heart sounds.  Pulmonary:     Effort: Pulmonary effort is normal.     Breath sounds: Normal breath sounds.  Abdominal:     General: Abdomen is flat. Bowel sounds are normal.     Palpations: Abdomen is soft.  Musculoskeletal:        General: No swelling.     Cervical back: Neck supple. No rigidity or tenderness.  Skin:    General: Skin is warm and dry.  Neurological:     General: No focal deficit present.     Mental Status: She is alert.      Results for orders placed or performed in visit  on 06/26/22  TSH  Result Value Ref Range   TSH 3.860 0.450 - 4.500 uIU/mL  CMP14 + Anion Gap  Result Value Ref Range   Glucose 91 70 - 99 mg/dL   BUN 11 8 - 27 mg/dL   Creatinine, Ser 0.83 0.57 - 1.00 mg/dL   eGFR 75 >59 mL/min/1.73   BUN/Creatinine Ratio 13 12 - 28   Sodium 140 134 - 144 mmol/L   Potassium 4.4 3.5 - 5.2 mmol/L   Chloride 102 96 - 106 mmol/L   CO2 25 20 - 29 mmol/L   Anion Gap 13.0 10.0 - 18.0 mmol/L   Calcium 9.8 8.7 - 10.3 mg/dL   Total Protein 6.5 6.0 - 8.5 g/dL   Albumin 4.4 3.8 - 4.8 g/dL   Globulin, Total 2.1 1.5 - 4.5 g/dL   Albumin/Globulin Ratio 2.1 1.2 - 2.2   Bilirubin Total 0.3 0.0 - 1.2 mg/dL   Alkaline Phosphatase 137 (H) 44 - 121 IU/L   AST 19 0 - 40 IU/L   ALT 15 0 - 32 IU/L  Lipid Profile  Result Value Ref Range   Cholesterol, Total  217 (H) 100 - 199 mg/dL   Triglycerides 131 0 - 149 mg/dL   HDL 56 >39 mg/dL   VLDL Cholesterol Cal 23 5 - 40 mg/dL   LDL Chol Calc (NIH) 138 (H) 0 - 99 mg/dL   Chol/HDL Ratio 3.9 0.0 - 4.4 ratio      The 10-year ASCVD risk score (Arnett DK, et al., 2019) is: 10.7%    Assessment & Plan:   Problem List Items Addressed This Visit       Respiratory   COPD (chronic obstructive pulmonary disease) ([place])     Endocrine   Hypothyroidism (acquired) - Primary (Chronic)   Relevant Orders   TSH (Completed)   Other Visit Diagnoses     COPD (chronic obstructive pulmonary disease) with chronic bronchitis       Dyslipidemia       Relevant Orders   CMP14 + Anion Gap (Completed)   Lipid Profile (Completed)       No follow-ups on file.    Garwin Brothers, MD

## 2022-06-26 NOTE — Progress Notes (Unsigned)
   Established Patient Office Visit  Subjective   Patient ID: ERIKO ECONOMOS, female    DOB: September 23, 1950  Age: 71 y.o. MRN: [id number]  Chief Complaint  Patient presents with   Follow-up    HPI  {History (Optional):23778}  ROS    Objective:     BP 128/86 (BP Location: Right Arm, Patient Position: Sitting, Cuff Size: Normal)   Pulse 93   Temp 97.8 F (36.6 C) (Temporal)   Resp 18   Ht 5\' 1"  (1.549 m)   Wt 187 lb (84.8 kg)   SpO2 97%   BMI 35.33 kg/m  {Vitals History (Optional):23777}  Physical Exam   No results found for any visits on 06/26/22.  {Labs (Optional):23779}  The ASCVD Risk score (Arnett DK, et al., 2019) failed to calculate for the following reasons:   Cannot find a previous HDL lab   Cannot find a previous total cholesterol lab    Assessment & Plan:   Problem List Items Addressed This Visit   None   No follow-ups on file.    Andres Escandon, CMA

## 2022-06-27 ENCOUNTER — Encounter: Payer: Self-pay | Admitting: Internal Medicine

## 2022-06-27 LAB — LIPID PANEL
Chol/HDL Ratio: 3.9 ratio (ref 0.0–4.4)
Cholesterol, Total: 217 mg/dL — ABNORMAL HIGH (ref 100–199)
HDL: 56 mg/dL (ref >39)
LDL Chol Calc (NIH): 138 mg/dL — ABNORMAL HIGH (ref 0–99)
Triglycerides: 131 mg/dL (ref 0–149)
VLDL Cholesterol Cal: 23 mg/dL (ref 5–40)

## 2022-06-27 LAB — CMP14 + ANION GAP
ALT: 15 IU/L (ref 0–32)
AST: 19 IU/L (ref 0–40)
Albumin/Globulin Ratio: 2.1 (ref 1.2–2.2)
Albumin: 4.4 g/dL (ref 3.8–4.8)
Alkaline Phosphatase: 137 IU/L — ABNORMAL HIGH (ref 44–121)
Anion Gap: 13 mmol/L (ref 10.0–18.0)
BUN/Creatinine Ratio: 13 (ref 12–28)
BUN: 11 mg/dL (ref 8–27)
Bilirubin Total: 0.3 mg/dL (ref 0.0–1.2)
CO2: 25 mmol/L (ref 20–29)
Calcium: 9.8 mg/dL (ref 8.7–10.3)
Chloride: 102 mmol/L (ref 96–106)
Creatinine, Ser: 0.83 mg/dL (ref 0.57–1.00)
Globulin, Total: 2.1 g/dL (ref 1.5–4.5)
Glucose: 91 mg/dL (ref 70–99)
Potassium: 4.4 mmol/L (ref 3.5–5.2)
Sodium: 140 mmol/L (ref 134–144)
Total Protein: 6.5 g/dL (ref 6.0–8.5)
eGFR: 75 mL/min/{1.73_m2} (ref >59)

## 2022-06-27 LAB — TSH: TSH: 3.86 u[IU]/mL (ref 0.450–4.500)

## 2022-07-04 ENCOUNTER — Other Ambulatory Visit: Payer: Self-pay

## 2022-07-05 DIAGNOSIS — M5459 Other low back pain: Secondary | ICD-10-CM | POA: Diagnosis not present

## 2022-07-05 MED ORDER — LEVOTHYROXINE SODIUM 125 MCG PO TABS: 125.0000 ug | ORAL_TABLET | Freq: Every day | ORAL | 2 refills | Status: DC

## 2022-09-27 ENCOUNTER — Ambulatory Visit: Payer: 59 | Admitting: Internal Medicine

## 2022-09-27 ENCOUNTER — Encounter: Payer: Self-pay | Admitting: Internal Medicine

## 2022-09-27 VITALS — BP 126/90 | HR 70 | Temp 97.3°F | Resp 18 | Ht 62.0 in | Wt 188.5 lb

## 2022-09-27 DIAGNOSIS — J449 Chronic obstructive pulmonary disease, unspecified: Secondary | ICD-10-CM | POA: Diagnosis not present

## 2022-09-27 DIAGNOSIS — E039 Hypothyroidism, unspecified: Secondary | ICD-10-CM | POA: Diagnosis not present

## 2022-09-27 DIAGNOSIS — M79604 Pain in right leg: Secondary | ICD-10-CM

## 2022-09-27 DIAGNOSIS — M545 Low back pain, unspecified: Secondary | ICD-10-CM

## 2022-09-27 DIAGNOSIS — G8929 Other chronic pain: Secondary | ICD-10-CM

## 2022-09-27 DIAGNOSIS — M549 Dorsalgia, unspecified: Secondary | ICD-10-CM | POA: Insufficient documentation

## 2022-09-27 HISTORY — DX: Pain in right leg: M79.604

## 2022-09-27 MED ORDER — TRAMADOL HCL 50 MG PO TABS: 50.0000 mg | ORAL_TABLET | Freq: Two times a day (BID) | ORAL | 1 refills | Status: DC | PRN

## 2022-09-27 MED ORDER — LEVOTHYROXINE SODIUM 125 MCG PO TABS: 125.0000 ug | ORAL_TABLET | Freq: Every day | ORAL | 6 refills | Status: DC

## 2022-09-27 NOTE — Progress Notes (Addendum)
   Office Visit  Subjective   Patient ID: Stacey Poole   DOB: [birth year]   Age: 72 y.o.   MRN: [id number]   Chief Complaint Chief Complaint  Patient presents with   Follow-up    Hypothyroidism     History of Present Illness 72 years old female is here for follow up. She says her back pain flared up again, She has back pain and had back surgery last year. She was much better until few days ago, she started hurting more and this is the same pain that she had before. She has appointment with spine surgeon in March, 24.  She had hypothyroidism and her TSH level was also normal. She says she need refill of leveothyroxine.   She also has COPD and her symptoms are stable.   Past Medical History Past Medical History:  Diagnosis Date   Complication of anesthesia    difficulty waking up after anesthesia   COPD (chronic obstructive pulmonary disease) ([place])    Dyspnea    Pneumonia      Allergies No Known Allergies   Review of Systems Review of Systems  Constitutional: Negative.   Respiratory: Negative.    Cardiovascular: Negative.   Musculoskeletal:  Positive for back pain.       Objective:    Vitals BP (!) 126/90 (BP Location: Left Arm, Patient Position: Sitting, Cuff Size: Normal)   Pulse 70   Temp (!) 97.3 F (36.3 C)   Resp 18   Ht 5\' 2"  (1.575 m)   Wt 188 lb 8 oz (85.5 kg)   SpO2 98%   BMI 34.48 kg/m    Physical Examination Physical Exam Constitutional:      Appearance: She is obese.  HENT:     Head: Normocephalic and atraumatic.  Cardiovascular:     Rate and Rhythm: Normal rate and regular rhythm.     Heart sounds: Normal heart sounds.  Pulmonary:     Effort: Pulmonary effort is normal.     Breath sounds: Normal breath sounds.  Abdominal:     General: Bowel sounds are normal.     Palpations: Abdomen is soft.  Neurological:     General: No focal deficit present.     Mental Status: She is alert and oriented to person, place, and time.         Assessment & Plan:   COPD (chronic obstructive pulmonary disease) ([place]) Stable.  Hypothyroidism (acquired) TSH level was therapeutic in November 23. So will continue the same dose.  Back pain She will take tramadol as needed basis for back pain and follows with spine surgeon.    Return in about 3 months (around 12/26/2022).   Garwin Brothers, MD

## 2022-09-27 NOTE — Assessment & Plan Note (Signed)
TSH level was therapeutic in November 23. So will continue the same dose.

## 2022-09-27 NOTE — Assessment & Plan Note (Signed)
She will take tramadol as needed basis for back pain and follows with spine surgeon.

## 2022-09-27 NOTE — Assessment & Plan Note (Signed)
Stable. 

## 2022-11-01 DIAGNOSIS — Z4889 Encounter for other specified surgical aftercare: Secondary | ICD-10-CM | POA: Diagnosis not present

## 2022-11-03 ENCOUNTER — Ambulatory Visit: Payer: 59 | Admitting: Internal Medicine

## 2022-11-03 ENCOUNTER — Encounter: Payer: Self-pay | Admitting: Internal Medicine

## 2022-11-03 VITALS — BP 140/88 | HR 61 | Temp 97.8°F | Resp 18 | Ht 62.0 in | Wt 189.5 lb

## 2022-11-03 DIAGNOSIS — J441 Chronic obstructive pulmonary disease with (acute) exacerbation: Secondary | ICD-10-CM

## 2022-11-03 MED ORDER — METHYLPREDNISOLONE SODIUM SUCC 40 MG IJ SOLR
80.0000 mg | Freq: Once | INTRAMUSCULAR | Status: AC
  Administered 2022-11-03: 80 mg via INTRAMUSCULAR

## 2022-11-03 MED ORDER — METHYLPREDNISOLONE 4 MG PO TBPK: ORAL_TABLET | ORAL | 0 refills | Status: DC

## 2022-11-03 MED ORDER — ALBUTEROL SULFATE HFA 108 (90 BASE) MCG/ACT IN AERS: 2.0000 | INHALATION_SPRAY | Freq: Four times a day (QID) | RESPIRATORY_TRACT | 2 refills | Status: DC | PRN

## 2022-11-03 NOTE — Patient Instructions (Signed)
If she is not better by Monday then will do cxray. If she get worse then will go to ED

## 2022-11-03 NOTE — Progress Notes (Signed)
   Acute Office Visit  Subjective:     Patient ID: Stacey Poole, female    DOB: [birth year], 72 y.o.   MRN: [id number]  Chief Complaint  Patient presents with   Cough    Cough, headache,, and Wheezing    Cough Associated symptoms include shortness of breath.   Patient is in today for sinus congestion for 2 weeks. She says get out of breath when going from one room to other. She is wheezing. She has COPD and take albuterol as needed.  Review of Systems  Constitutional: Negative.   HENT:  Positive for congestion.   Respiratory:  Positive for cough and shortness of breath.   Cardiovascular: Negative.   Gastrointestinal: Negative.   Neurological: Negative.         Objective:    BP (!) 140/88 (BP Location: Left Arm, Patient Position: Sitting, Cuff Size: Normal)   Pulse 61   Temp 97.8 F (36.6 C)   Resp 18   Ht 5\' 2"  (1.575 m)   Wt 189 lb 8 oz (86 kg)   SpO2 96%   BMI 34.66 kg/m    Physical Exam Constitutional:      Appearance: Normal appearance.  HENT:     Head: Normocephalic and atraumatic.  Eyes:     Extraocular Movements: Extraocular movements intact.     Pupils: Pupils are equal, round, and reactive to light.  Cardiovascular:     Rate and Rhythm: Normal rate and regular rhythm.     Heart sounds: Normal heart sounds.  Pulmonary:     Breath sounds: Wheezing and rhonchi present.  Abdominal:     General: Bowel sounds are normal.     Palpations: Abdomen is soft.  Neurological:     Mental Status: She is alert.     No results found for any visits on 11/03/22.      Assessment & Plan:   Problem List Items Addressed This Visit       Respiratory   COPD (chronic obstructive pulmonary disease) ([place]) - Primary    I will give her solumedrol and medrol dose pack and antibiotic.       No orders of the defined types were placed in this encounter.   Return in about 1 month (around 12/04/2022).  Garwin Brothers, MD

## 2022-11-03 NOTE — Assessment & Plan Note (Signed)
I will give her solumedrol and medrol dose pack and antibiotic.

## 2022-11-06 DIAGNOSIS — J441 Chronic obstructive pulmonary disease with (acute) exacerbation: Secondary | ICD-10-CM | POA: Diagnosis not present

## 2022-11-06 DIAGNOSIS — R0602 Shortness of breath: Secondary | ICD-10-CM | POA: Diagnosis not present

## 2022-11-06 DIAGNOSIS — R062 Wheezing: Secondary | ICD-10-CM | POA: Diagnosis not present

## 2022-12-04 ENCOUNTER — Ambulatory Visit: Payer: 59 | Admitting: Internal Medicine

## 2022-12-25 ENCOUNTER — Ambulatory Visit: Payer: 59 | Admitting: Internal Medicine

## 2023-02-07 DIAGNOSIS — M25551 Pain in right hip: Secondary | ICD-10-CM | POA: Diagnosis not present

## 2023-02-07 DIAGNOSIS — M545 Low back pain, unspecified: Secondary | ICD-10-CM | POA: Diagnosis not present

## 2023-03-15 DIAGNOSIS — I700 Atherosclerosis of aorta: Secondary | ICD-10-CM | POA: Diagnosis not present

## 2023-03-15 DIAGNOSIS — M5416 Radiculopathy, lumbar region: Secondary | ICD-10-CM | POA: Diagnosis not present

## 2023-04-03 DIAGNOSIS — M5416 Radiculopathy, lumbar region: Secondary | ICD-10-CM | POA: Diagnosis not present

## 2023-04-06 ENCOUNTER — Other Ambulatory Visit: Payer: Self-pay | Admitting: Internal Medicine

## 2023-04-11 DIAGNOSIS — M25551 Pain in right hip: Secondary | ICD-10-CM | POA: Diagnosis not present

## 2023-04-11 DIAGNOSIS — M5416 Radiculopathy, lumbar region: Secondary | ICD-10-CM | POA: Diagnosis not present

## 2023-05-02 ENCOUNTER — Encounter: Payer: Self-pay | Admitting: Internal Medicine

## 2023-05-02 ENCOUNTER — Ambulatory Visit: Payer: 59 | Admitting: Internal Medicine

## 2023-05-02 VITALS — BP 139/90 | HR 78 | Temp 97.7°F | Resp 18 | Ht 62.0 in | Wt 189.2 lb

## 2023-05-02 DIAGNOSIS — M79604 Pain in right leg: Secondary | ICD-10-CM | POA: Diagnosis not present

## 2023-05-02 DIAGNOSIS — M25551 Pain in right hip: Secondary | ICD-10-CM | POA: Insufficient documentation

## 2023-05-02 DIAGNOSIS — M545 Low back pain, unspecified: Secondary | ICD-10-CM

## 2023-05-02 HISTORY — DX: Pain in right hip: M25.551

## 2023-05-02 MED ORDER — CELECOXIB 200 MG PO CAPS: 200.0000 mg | ORAL_CAPSULE | Freq: Every day | ORAL | 2 refills | Status: DC

## 2023-05-02 MED ORDER — PREGABALIN 75 MG PO CAPS: 75.0000 mg | ORAL_CAPSULE | Freq: Two times a day (BID) | ORAL | 2 refills | Status: DC

## 2023-05-02 NOTE — Assessment & Plan Note (Signed)
I will start her on celebrex 200 mg daily for 2 weeks then as needed. She will also continue to follow with pain clinic. I will also start her on pregabalin 75 mg twice a day.

## 2023-05-02 NOTE — Progress Notes (Signed)
   Office Visit  Subjective   Patient ID: Stacey Poole   DOB: Jun 30, 1951   Age: 72 y.o.   MRN: [id number]   Chief Complaint Chief Complaint  Patient presents with   Office visit    Rt. Hip pain     History of Present Illness She started having different back pain that radiate to right buttock and right groin. This is a different pain. She saw pain clinic. They have given her percocet that made her sick. She was also referred to see orthopedic for hip evaluate.  She has lower back pain with radiculopathy and had back surgery 10/2021. That pain was better but pain came back but this pain is different.   She has COPD is better. She does not use albuterol for her breathing issues. She does not smoke.   She also has hypothyroidism and take levothyroxine 125 mcg daily. Her TSH was normal in November.    Past Medical History Past Medical History:  Diagnosis Date   Complication of anesthesia    difficulty waking up after anesthesia   COPD (chronic obstructive pulmonary disease) ([facility])    Dyspnea    Pneumonia      Allergies No Known Allergies   Review of Systems Review of Systems  Constitutional: Negative.   HENT: Negative.    Respiratory: Negative.    Cardiovascular: Negative.   Gastrointestinal: Negative.   Musculoskeletal:  Positive for back pain and joint pain.  Neurological: Negative.        Objective:    Vitals BP (!) 139/90 (BP Location: Left Arm, Patient Position: Sitting, Cuff Size: Normal)   Pulse 78   Temp 97.7 F (36.5 C)   Resp 18   Ht 5\' 2"  (1.575 m)   Wt 189 lb 4 oz (85.8 kg)   SpO2 97%   BMI 34.61 kg/m    Physical Examination Physical Exam Constitutional:      Appearance: Normal appearance. She is obese.  Musculoskeletal:        General: Tenderness present.     Comments: Her lower right back and right hip area is very sensitive and tender to touch.   Neurological:     General: No focal deficit present.     Mental Status: She is alert.         Assessment & Plan:   Low back pain radiating to right lower extremity I will start her on celebrex 200 mg daily for 2 weeks then as needed. She will also continue to follow with pain clinic. I will also start her on pregabalin 75 mg twice a day.   Right hip pain She will follow with Orthopedic surgeon about her hip pain.     Return in about 2 months (around 07/02/2023).   [name], MD

## 2023-05-02 NOTE — Assessment & Plan Note (Signed)
She will follow with Orthopedic surgeon about her hip pain.

## 2023-06-29 ENCOUNTER — Ambulatory Visit: Payer: 59 | Admitting: Internal Medicine

## 2023-06-29 ENCOUNTER — Encounter: Payer: Self-pay | Admitting: Internal Medicine

## 2023-06-29 VITALS — BP 126/90 | HR 70 | Temp 97.8°F | Resp 18 | Ht 62.0 in | Wt 199.6 lb

## 2023-06-29 DIAGNOSIS — J449 Chronic obstructive pulmonary disease, unspecified: Secondary | ICD-10-CM

## 2023-06-29 DIAGNOSIS — Z1321 Encounter for screening for nutritional disorder: Secondary | ICD-10-CM | POA: Diagnosis not present

## 2023-06-29 DIAGNOSIS — I700 Atherosclerosis of aorta: Secondary | ICD-10-CM | POA: Diagnosis not present

## 2023-06-29 DIAGNOSIS — M25551 Pain in right hip: Secondary | ICD-10-CM

## 2023-06-29 DIAGNOSIS — Z131 Encounter for screening for diabetes mellitus: Secondary | ICD-10-CM

## 2023-06-29 DIAGNOSIS — M79604 Pain in right leg: Secondary | ICD-10-CM

## 2023-06-29 DIAGNOSIS — Z0000 Encounter for general adult medical examination without abnormal findings: Secondary | ICD-10-CM | POA: Diagnosis not present

## 2023-06-29 DIAGNOSIS — E039 Hypothyroidism, unspecified: Secondary | ICD-10-CM | POA: Diagnosis not present

## 2023-06-29 DIAGNOSIS — R0609 Other forms of dyspnea: Secondary | ICD-10-CM

## 2023-06-29 DIAGNOSIS — I2510 Atherosclerotic heart disease of native coronary artery without angina pectoris: Secondary | ICD-10-CM

## 2023-06-29 DIAGNOSIS — M545 Low back pain, unspecified: Secondary | ICD-10-CM | POA: Diagnosis not present

## 2023-06-29 DIAGNOSIS — R011 Cardiac murmur, unspecified: Secondary | ICD-10-CM | POA: Diagnosis not present

## 2023-06-29 DIAGNOSIS — Z6836 Body mass index (BMI) 36.0-36.9, adult: Secondary | ICD-10-CM | POA: Diagnosis not present

## 2023-06-29 DIAGNOSIS — Z981 Arthrodesis status: Secondary | ICD-10-CM

## 2023-06-29 HISTORY — DX: Atherosclerosis of aorta: I70.0

## 2023-06-29 HISTORY — DX: Other forms of dyspnea: R06.09

## 2023-06-29 HISTORY — DX: Encounter for screening for diabetes mellitus: Z13.1

## 2023-06-29 HISTORY — DX: Cardiac murmur, unspecified: R01.1

## 2023-06-29 HISTORY — DX: Atherosclerotic heart disease of native coronary artery without angina pectoris: I25.10

## 2023-06-29 NOTE — Assessment & Plan Note (Signed)
Probably aortic stenosis and I will order 2D echo.

## 2023-06-29 NOTE — Assessment & Plan Note (Signed)
Better  

## 2023-06-29 NOTE — Assessment & Plan Note (Signed)
She has significant coronary calcification.  She has a heart murmur and exertional dyspnea and I will do echo.  I will do lipid panel today.

## 2023-06-29 NOTE — Assessment & Plan Note (Signed)
I will do lipid panel today.

## 2023-06-29 NOTE — Assessment & Plan Note (Signed)
Better with Celebrex.

## 2023-06-29 NOTE — Assessment & Plan Note (Signed)
I will do hemoglobin A1c today.

## 2023-06-29 NOTE — Assessment & Plan Note (Signed)
I will do TSH today.

## 2023-06-29 NOTE — Assessment & Plan Note (Signed)
I have order 2D echo and then re-evaluate.

## 2023-06-29 NOTE — Progress Notes (Signed)
Office Visit  Subjective   Patient ID: Stacey Poole   DOB: 1951-03-06   Age: 72 y.o.   MRN: [id number]   Chief Complaint Chief Complaint  Patient presents with   Annual Exam     History of Present Illness 72 years old female is here for AWE.   She has a chronic low back pain and right hip pain.  She has a back surgery done  February 2023 but started having pain again.  I have started her on Celebrex and pregabalin and she says that her pain is gone.  She was also seen by [facility] for right hip pain and she has says that her right hip pain is resolved.  She fell 1 time and that is when she has pneumonia.  No more fall.      She has hypothyroidism and takes levothyroxine 125 mcg daily.    She has COPD but her breathing is better unless she walked longer but does not take any inhaler.  She walks for exercise reason.  She does not have any balance issue.  She drive without any problem.  I have reviewed her mini-mental status examination and she scored 30/30.  She has score 1 on mini-mental examination.   She get mammogram every year but did not have mammogram this year.  I will schedule that.  She had colonoscopy 3 years ago and next 1 will be in 5 years.   She had hysterectomy.   She lives alone.  She quit smoking in 2022. She does not drink.  She walk for exercise reason.   She has family history of diabetes and heart disease any her both parents.  No cancer in the family.   She does not get flu shot.  She did has pneumonia vaccine and 2 shingles vaccine.    She had bone density scan few years ago and need to be repeated now.    She has history of aortic atherosclerosis and coronary atherosclerosis.  I will do lipid panel today.  Past Medical History Past Medical History:  Diagnosis Date   Complication of anesthesia    difficulty waking up after anesthesia   COPD (chronic obstructive pulmonary disease) (HCC)    Dyspnea    Pneumonia      Allergies No Known Allergies    Review of Systems Review of Systems  Constitutional: Negative.   HENT: Negative.    Respiratory:  Positive for shortness of breath.   Cardiovascular: Negative.   Gastrointestinal: Negative.   Neurological: Negative.        Objective:    Vitals BP (!) 126/90 (BP Location: Left Arm, Patient Position: Sitting, Cuff Size: Normal)   Pulse 70   Temp 97.8 F (36.6 C)   Resp 18   Ht 5\' 2"  (1.575 m)   Wt 199 lb 9 oz (90.5 kg)   SpO2 97%   BMI 36.50 kg/m    Physical Examination Physical Exam Constitutional:      Appearance: Normal appearance. She is obese.  HENT:     Head: Normocephalic and atraumatic.  Cardiovascular:     Rate and Rhythm: Normal rate and regular rhythm.     Heart sounds: Normal heart sounds.  Pulmonary:     Effort: Pulmonary effort is normal.     Breath sounds: Normal breath sounds.  Abdominal:     General: Bowel sounds are normal.     Palpations: Abdomen is soft.  Neurological:     General: No focal  deficit present.     Mental Status: She is alert and oriented to person, place, and time.        Assessment & Plan:   Aortic atherosclerosis (HCC)   I will do lipid panel today.  Coronary artery disease involving native heart without angina pectoris   She has significant coronary calcification.  She has a heart murmur and exertional dyspnea and I will do echo.  I will do lipid panel today.  COPD (chronic obstructive pulmonary disease) (HCC)   Stable.  Hypothyroidism (acquired)   I will do TSH today.  Exertional dyspnea   I have order 2D echo and then re-evaluate.  Low back pain radiating to right lower extremity   Better.  Heart murmur   Probably aortic stenosis and I will order 2D echo.  Right hip pain   Better with Celebrex.  Screening for diabetes mellitus (DM)   I will do hemoglobin A1c today.    Return in about 3 months (around 09/29/2023).   [name], MD

## 2023-06-29 NOTE — Assessment & Plan Note (Signed)
Stable

## 2023-07-02 LAB — CBC WITH DIFFERENTIAL/PLATELET
Basophils Absolute: 0 x10E3/uL (ref 0.0–0.2)
Basos: 0 %
EOS (ABSOLUTE): 0.1 x10E3/uL (ref 0.0–0.4)
Eos: 1 %
Hematocrit: 48 % — ABNORMAL HIGH (ref 34.0–46.6)
Hemoglobin: 15.4 g/dL (ref 11.1–15.9)
Immature Grans (Abs): 0 x10E3/uL (ref 0.0–0.1)
Immature Granulocytes: 0 %
Lymphocytes Absolute: 1.6 x10E3/uL (ref 0.7–3.1)
Lymphs: 26 %
MCH: 32 pg (ref 26.6–33.0)
MCHC: 32.1 g/dL (ref 31.5–35.7)
MCV: 100 fL — ABNORMAL HIGH (ref 79–97)
Monocytes Absolute: 0.5 x10E3/uL (ref 0.1–0.9)
Monocytes: 7 %
Neutrophils Absolute: 4.1 x10E3/uL (ref 1.4–7.0)
Neutrophils: 66 %
Platelets: 238 x10E3/uL (ref 150–450)
RBC: 4.81 x10E6/uL (ref 3.77–5.28)
RDW: 13.1 % (ref 11.7–15.4)
WBC: 6.3 x10E3/uL (ref 3.4–10.8)

## 2023-07-02 LAB — LIPID PANEL
Chol/HDL Ratio: 4.2 ratio (ref 0.0–4.4)
Cholesterol, Total: 225 mg/dL — ABNORMAL HIGH (ref 100–199)
HDL: 54 mg/dL
LDL Chol Calc (NIH): 150 mg/dL — ABNORMAL HIGH (ref 0–99)
Triglycerides: 117 mg/dL (ref 0–149)
VLDL Cholesterol Cal: 21 mg/dL (ref 5–40)

## 2023-07-02 LAB — CMP14 + ANION GAP
ALT: 17 [IU]/L (ref 0–32)
AST: 24 [IU]/L (ref 0–40)
Albumin: 4.5 g/dL (ref 3.8–4.8)
Alkaline Phosphatase: 153 [IU]/L — ABNORMAL HIGH (ref 44–121)
Anion Gap: 14 mmol/L (ref 10.0–18.0)
BUN/Creatinine Ratio: 9 — ABNORMAL LOW (ref 12–28)
BUN: 8 mg/dL (ref 8–27)
Bilirubin Total: 0.3 mg/dL (ref 0.0–1.2)
CO2: 24 mmol/L (ref 20–29)
Calcium: 9.7 mg/dL (ref 8.7–10.3)
Chloride: 103 mmol/L (ref 96–106)
Creatinine, Ser: 0.9 mg/dL (ref 0.57–1.00)
Globulin, Total: 2.3 g/dL (ref 1.5–4.5)
Glucose: 96 mg/dL (ref 70–99)
Potassium: 4.5 mmol/L (ref 3.5–5.2)
Sodium: 141 mmol/L (ref 134–144)
Total Protein: 6.8 g/dL (ref 6.0–8.5)
eGFR: 68 mL/min/{1.73_m2} (ref >59)

## 2023-07-02 LAB — HEMOGLOBIN A1C
Est. average glucose Bld gHb Est-mCnc: 123 mg/dL
Hgb A1c MFr Bld: 5.9 % — ABNORMAL HIGH (ref 4.8–5.6)

## 2023-07-02 LAB — VITAMIN D 25 HYDROXY (VIT D DEFICIENCY, FRACTURES): Vit D, 25-Hydroxy: 7.8 ng/mL — ABNORMAL LOW (ref 30.0–100.0)

## 2023-07-02 LAB — TSH: TSH: 54.2 u[IU]/mL — ABNORMAL HIGH (ref 0.450–4.500)

## 2023-07-09 ENCOUNTER — Other Ambulatory Visit: Payer: Self-pay | Admitting: Internal Medicine

## 2023-07-09 ENCOUNTER — Other Ambulatory Visit: Payer: Self-pay

## 2023-07-09 MED ORDER — VITAMIN D-3 125 MCG (5000 UT) PO TABS: 1.0000 | ORAL_TABLET | Freq: Every day | ORAL | 3 refills | Status: DC

## 2023-07-09 MED ORDER — LEVOTHYROXINE SODIUM 125 MCG PO CAPS: 125.0000 ug | ORAL_CAPSULE | Freq: Every day | ORAL | 5 refills | Status: DC

## 2023-07-24 ENCOUNTER — Telehealth: Payer: Self-pay | Admitting: Internal Medicine

## 2023-07-24 NOTE — Telephone Encounter (Signed)
I have called and left the message for her to callback to discuss lab results.

## 2023-07-25 DIAGNOSIS — J189 Pneumonia, unspecified organism: Secondary | ICD-10-CM | POA: Insufficient documentation

## 2023-07-25 DIAGNOSIS — T8859XA Other complications of anesthesia, initial encounter: Secondary | ICD-10-CM | POA: Insufficient documentation

## 2023-07-25 DIAGNOSIS — R0600 Dyspnea, unspecified: Secondary | ICD-10-CM | POA: Insufficient documentation

## 2023-07-25 NOTE — Progress Notes (Signed)
Patient called.  Patient aware. Her labs are OK

## 2023-07-26 ENCOUNTER — Ambulatory Visit: Payer: 59

## 2023-07-26 VITALS — BP 150/80 | HR 70 | Ht 62.0 in | Wt 198.2 lb

## 2023-07-26 DIAGNOSIS — R011 Cardiac murmur, unspecified: Secondary | ICD-10-CM

## 2023-07-26 DIAGNOSIS — I340 Nonrheumatic mitral (valve) insufficiency: Secondary | ICD-10-CM

## 2023-07-26 DIAGNOSIS — I350 Nonrheumatic aortic (valve) stenosis: Secondary | ICD-10-CM

## 2023-07-26 NOTE — Patient Instructions (Signed)
Medication Instructions:  Your physician recommends that you continue on your current medications as directed. Please refer to the Current Medication list given to you today.  *If you need a refill on your cardiac medications before your next appointment, please call your pharmacy*   Lab Work: None If you have labs (blood work) drawn today and your tests are completely normal, you will receive your results only by: MyChart Message (if you have MyChart) OR A paper copy in the mail If you have any lab test that is abnormal or we need to change your treatment, we will call you to review the results.   Testing/Procedures: Your physician has requested that you have an echocardiogram. Echocardiography is a painless test that uses sound waves to create images of your heart. It provides your doctor with information about the size and shape of your heart and how well your heart's chambers and valves are working. This procedure takes approximately one hour. There are no restrictions for this procedure. Please do NOT wear cologne, perfume, aftershave, or lotions (deodorant is allowed). Please arrive 15 minutes prior to your appointment time.  Please note: We ask at that you not bring children with you during ultrasound (echo/ vascular) testing. Due to room size and safety concerns, children are not allowed in the ultrasound rooms during exams. Our front office staff cannot provide observation of children in our lobby area while testing is being conducted. An adult accompanying a patient to their appointment will only be allowed in the ultrasound room at the discretion of the ultrasound technician under special circumstances. We apologize for any inconvenience.    Follow-Up: At [facility], you and your health needs are our priority.  As part of our continuing mission to provide you with exceptional heart care, we have created designated Provider Care Teams.  These Care Teams include your  primary Cardiologist (physician) and Advanced Practice Providers (APPs -  Physician Assistants and Nurse Practitioners) who all work together to provide you with the care you need, when you need it.  We recommend signing up for the patient portal called "MyChart".  Sign up information is provided on this After Visit Summary.  MyChart is used to connect with patients for Virtual Visits (Telemedicine).  Patients are able to view lab/test results, encounter notes, upcoming appointments, etc.  Non-urgent messages can be sent to your provider as well.   To learn more about what you can do with MyChart, go to [email or web address].    Your next appointment:   Follow up to be determined after testing  Provider:   Huntley Dec, MD    Other Instructions None

## 2023-07-26 NOTE — Assessment & Plan Note (Addendum)
Appears possibly an ejection systolic murmur secondary to aortic valve.  Echocardiogram ordered by PCP currently not yet scheduled. Will request this to be scheduled and results forwarded to me as well.  Benign flow murmur cannot be entirely occluded given her significant hypothyroidism.

## 2023-07-26 NOTE — Progress Notes (Addendum)
Cardiology Consultation:    Date:  07/26/2023   ID:  Stacey Poole, DOB 1951-01-31, MRN [id number]  PCP:  Stacey Northern, MD  Cardiologist:  Stacey Corporal Latif Nazareno, MD   Referring MD: Stacey Northern, MD   No chief complaint on file.    ASSESSMENT AND PLAN:   Stacey Poole 72 year old with no significant prior cardiac history, has chronic back pain that limits her mobility to an extent but able to do her activities of day-to-day living and outdoor activities without any significant limitation, hypothyroidism, hyperlipidemia, prediabetes, obesity, with systolic ejection murmur here for evaluation.  Problem List Items Addressed This Visit     Systolic murmur - Primary    Appears possibly an ejection systolic murmur secondary to aortic valve.  Echocardiogram ordered by PCP currently not yet scheduled. Will request this to be scheduled and results forwarded to me as well.       Relevant Orders   EKG 12-Lead (Completed)   ECHOCARDIOGRAM COMPLETE   Will evaluate further in the office based on test results from the echocardiogram. Recommend following closely with PCP with regards to suboptimal blood pressure readings today at the office, overall cardiovascular risk factors in the form of obesity, prediabetes.  Addendum 09-06-2023: - Echocardiogram results reviewed from 08-23-2023 noted normal biventricular function and mild concentric left ventricular hypertrophy and grade 1 diastolic dysfunction. Mild MR reported. Images reviewed myself note aortic valve poorly visualized, probably trileaflet, there is increased flow across aortic valve with V-max 2.1 m/s, LVOT diameter 1.9 cm, aortic valve area calculated around 1.6 cm, suggests early stages of aortic stenosis versus mild aortic stenosis. Both these valve findings could be the reason and explanation for her heart murmur audible on physical exam. These are not expected to cause any significant clinical symptoms at this time.  I would recommend  follow-up in the office tentatively in 18 months with a repeat echocardiogram done prior to the next visit.  History of Present Illness:    Stacey Poole is a 72 y.o. female who is being seen today for the evaluation of heart murmur at the request of Stacey Northern, MD.   Very pleasant woman here for the visit by herself. Mentions no significant prior cardiac history. Has chronic back pain history for which she had surgery done in 2023, former smoker [quit in 2023 of the time of her back surgery, prior to that was smoking a pack in about 4 days], hypothyroidism, hyperlipidemia, obesity, prediabetes [hemoglobin A1c 5.9 recently].  Denies any active cardiac symptoms. Was found with heart murmur noted on recent physical exam at PCPs office and was referred for further evaluation.  An echocardiogram ordered by PCP has not yet been scheduled.  She denies any active symptoms of chest pain or shortness of breath. No significant functional limitations other than due to her back pain.  Denies any orthopnea or paroxysmal nocturnal dyspnea.  Denies any palpitations, lightheadedness, dizziness or syncopal episodes.  She is to smoke about a pack in 4 to 5 days, quit last year at the time of her back surgery. No alcohol consumption No recreational drug use.  Mentions mother with significant cardiac health issues at an elderly age. Sister with history of CAD.  EKG in the clinic today shows sinus rhythm with heart rate 70/min, PR interval 170 ms normal.  Normal QRS duration 74 ms.  Nonspecific ST T changes in inferolateral leads.  No significant change in comparison to prior EKG from October 17, 2021  She reports  having had a stress test done many years ago which was reportedly normal, done at [facility], records not available to review at this time.   Past Medical History:  Diagnosis Date   Aortic atherosclerosis (HCC) 06/29/2023   Complication of anesthesia    difficulty waking up after  anesthesia   COPD (chronic obstructive pulmonary disease) (HCC)    Coronary artery disease involving native heart without angina pectoris 06/29/2023   Dyspnea    Exertional dyspnea 06/29/2023   Heart murmur 06/29/2023   Hypothyroidism (acquired) 06/26/2022   Low back pain radiating to right lower extremity 09/27/2022   Lumbar radiculopathy 07/20/2021   Pneumonia    Right hip pain 05/02/2023   S/P lumbar fusion 10/20/2021   Screening for diabetes mellitus (DM) 06/29/2023    Past Surgical History:  Procedure Laterality Date   ABDOMINAL HYSTERECTOMY     ANTERIOR LATERAL LUMBAR FUSION WITH PERCUTANEOUS SCREW 1 LEVEL N/A 10/20/2021   Procedure: EXTREME LATERAL INTERBODY FUSION LUMBAR THREE THROUGH FOUR WITH POSTERIOR SPINAL FUSION INSTRUMENTATION;  Surgeon: Venita Lick, MD;  Location: [facility] OR;  Service: Orthopedics;  Laterality: N/A;  4.5 hrs Left tap block with exparel 3 C-Bed   APPENDECTOMY     CESAREAN SECTION     x4   CHOLECYSTECTOMY     DILATION AND CURETTAGE OF UTERUS     EYE SURGERY     MENISCUS REPAIR Left    TONSILLECTOMY      Current Medications: No outpatient medications have been marked as taking for the 07/26/23 encounter (Office Visit) with Larua Collier, Stacey Corporal, MD.     Allergies:   Patient has no known allergies.   Social History   Socioeconomic History   Marital status: Married    Spouse name: Not on file   Number of children: Not on file   Years of education: Not on file   Highest education level: Not on file  Occupational History   Not on file  Tobacco Use   Smoking status: Former    Current packs/day: 1.00    Types: Cigarettes   Smokeless tobacco: Never  Vaping Use   Vaping status: Never Used  Substance and Sexual Activity   Alcohol use: Never   Drug use: Never   Sexual activity: Not on file  Other Topics Concern   Not on file  Social History Narrative   Not on file   Social Determinants of Health   Financial Resource Strain: Not on file   Food Insecurity: Not on file  Transportation Needs: Not on file  Physical Activity: Not on file  Stress: Not on file  Social Connections: Not on file     Family History: The patient's family history is not on file. ROS:   Please see the history of present illness.    All 14 point review of systems negative except as described per history of present illness.  EKGs/Labs/Other Studies Reviewed:    The following studies were reviewed today:   EKG:  EKG Interpretation Date/Time:  Thursday July 26 2023 14:23:43 EST Ventricular Rate:  70 PR Interval:  170 QRS Duration:  74 QT Interval:  406 QTC Calculation: 438 R Axis:   31  Text Interpretation: Normal sinus rhythm Nonspecific ST and T wave abnormality Abnormal ECG When compared with ECG of 17-Oct-2021 14:53, Nonspecific T wave abnormality now evident in Inferior leads Nonspecific T wave abnormality, improved in Lateral leads Confirmed by Huntley Dec reddy [phone]) on 07/26/2023 3:36:28 PM    Recent  Labs: 06/29/2023: ALT 17; BUN 8; Creatinine, Ser 0.90; Hemoglobin 15.4; Platelets 238; Potassium 4.5; Sodium 141; TSH 54.200  Recent Lipid Panel    Component Value Date/Time   CHOL 225 (H) 06/29/2023 1042   TRIG 117 06/29/2023 1042   HDL 54 06/29/2023 1042   CHOLHDL 4.2 06/29/2023 1042   LDLCALC 150 (H) 06/29/2023 1042    Physical Exam:    VS:  BP (!) 150/80   Pulse 70   Ht 5\' 2"  (1.575 m)   Wt 198 lb 3.2 oz (89.9 kg)   SpO2 97%   BMI 36.25 kg/m     Wt Readings from Last 3 Encounters:  07/26/23 198 lb 3.2 oz (89.9 kg)  06/29/23 199 lb 9 oz (90.5 kg)  05/02/23 189 lb 4 oz (85.8 kg)     GENERAL:  Well nourished, well developed in no acute distress NECK: No JVD; No carotid bruits CARDIAC: RRR, S1 and S2 present, 4/6 ejection systolic murmur best heard in aortic area. CHEST:  Clear to auscultation without rales, wheezing or rhonchi  Extremities: No pitting pedal edema. Pulses bilaterally symmetric with radial  2+ and dorsalis pedis 2+ NEUROLOGIC:  Alert and oriented x 3  Medication Adjustments/Labs and Tests Ordered: Current medicines are reviewed at length with the patient today.  Concerns regarding medicines are outlined above.  Orders Placed This Encounter  Procedures   EKG 12-Lead   ECHOCARDIOGRAM COMPLETE   No orders of the defined types were placed in this encounter.   Signed, Apolo Cutshaw reddy Kabria Hetzer, MD, MPH, [facility]. 07/26/2023 3:54 PM    [facility]

## 2023-08-04 ENCOUNTER — Other Ambulatory Visit: Payer: Self-pay | Admitting: Internal Medicine

## 2023-08-07 ENCOUNTER — Other Ambulatory Visit: Payer: Self-pay | Admitting: Internal Medicine

## 2023-08-08 IMAGING — RF DG LUMBAR SPINE 2-3V
1 series · 4 of 4 positions shown · non-contrast
Comparison: 09/09/2021

CLINICAL DATA: L3-L4 interbody fusion

EXAM:
LUMBAR SPINE - 2-3 VIEW

[Series 1: run · 4 of 4 slices shown]
[im 1/4]
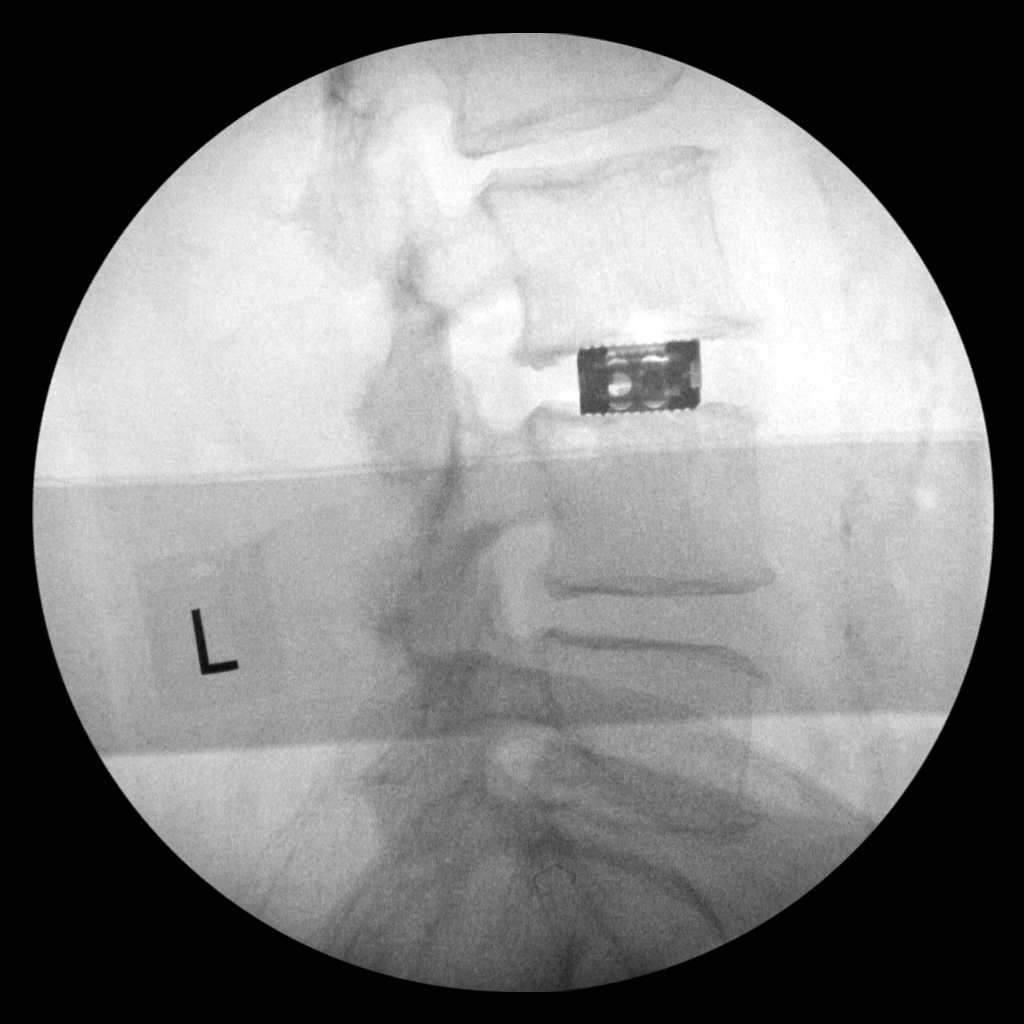
[im 2/4]
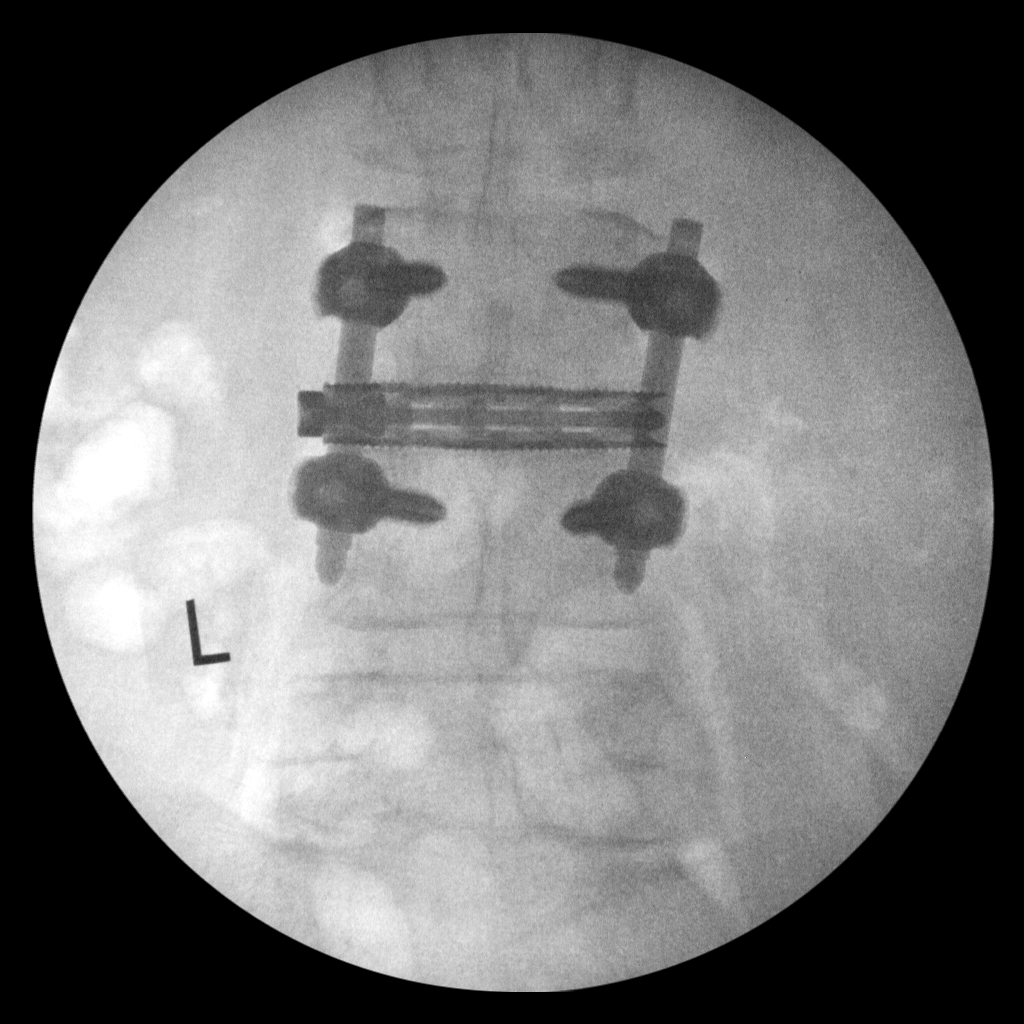
[im 3/4]
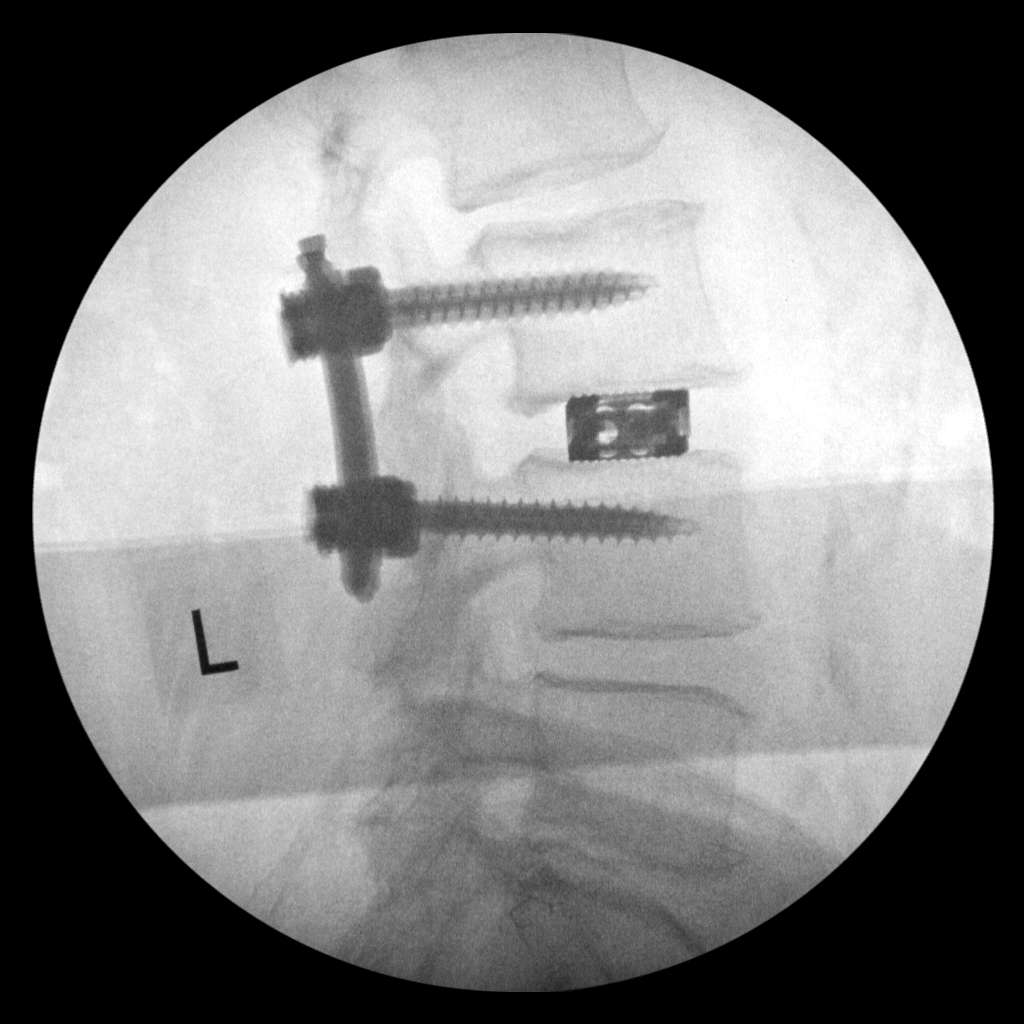
[im 4/4]
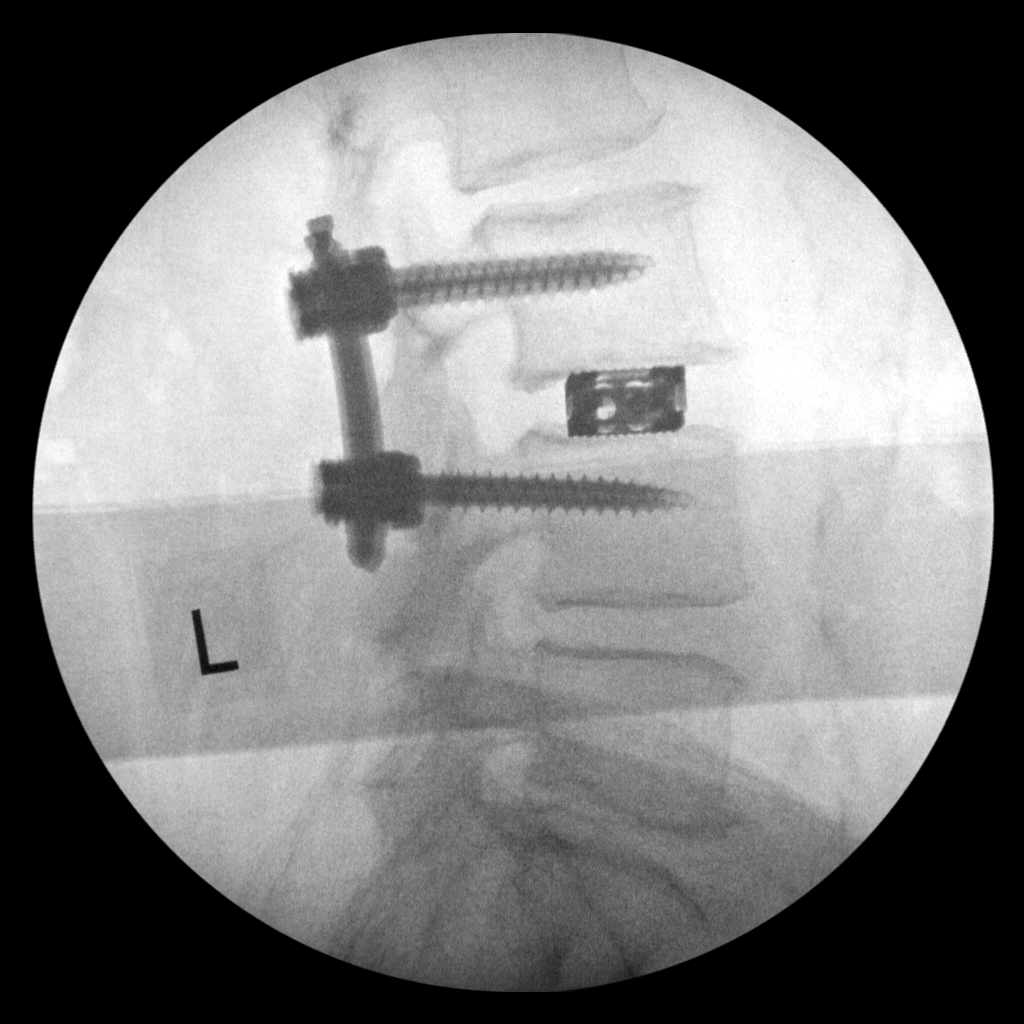

[4 of 4 positions shown; findings below may reference images not displayed]

FINDINGS: Four fluoroscopic images are obtained during the performance of the
procedure and are provided for interpretation only. Images
demonstrate L3-4 discectomy and posterior fusion, with anatomic
alignment. Please refer to the operative report.

Fluoroscopy time: 3 minutes 44 seconds, 172.48 mGy
IMPRESSION: 1. L3-4 discectomy and posterior fusion.

## 2023-08-23 ENCOUNTER — Ambulatory Visit: Payer: 59

## 2023-08-23 DIAGNOSIS — R011 Cardiac murmur, unspecified: Secondary | ICD-10-CM

## 2023-08-23 LAB — ECHOCARDIOGRAM COMPLETE
AR max vel: 1.74 cm2
AV Area VTI: 1.68 cm2
AV Area mean vel: 1.45 cm2
AV Mean grad: 10 mm[Hg]
AV Peak grad: 16.2 mm[Hg]
Ao pk vel: 2.01 m/s
Area-P 1/2: 3.26 cm2
MV M vel: 4.23 m/s
MV Peak grad: 71.6 mm[Hg]
Radius: 0.4 cm
S' Lateral: 3 cm

## 2023-08-27 ENCOUNTER — Other Ambulatory Visit: Payer: Self-pay

## 2023-08-27 MED ORDER — LEVOTHYROXINE SODIUM 125 MCG PO CAPS: 125.0000 ug | ORAL_CAPSULE | Freq: Every day | ORAL | 5 refills | Status: DC

## 2023-08-27 MED ORDER — LEVOTHYROXINE SODIUM 125 MCG PO TABS: 125.0000 ug | ORAL_TABLET | Freq: Every day | ORAL | 5 refills | Status: DC

## 2023-09-05 DIAGNOSIS — J4489 Other specified chronic obstructive pulmonary disease: Secondary | ICD-10-CM | POA: Diagnosis not present

## 2023-09-05 DIAGNOSIS — E038 Other specified hypothyroidism: Secondary | ICD-10-CM | POA: Diagnosis not present

## 2023-09-05 DIAGNOSIS — M25551 Pain in right hip: Secondary | ICD-10-CM | POA: Diagnosis not present

## 2023-09-05 DIAGNOSIS — R1031 Right lower quadrant pain: Secondary | ICD-10-CM | POA: Diagnosis not present

## 2023-09-06 ENCOUNTER — Telehealth: Payer: Self-pay

## 2023-09-06 DIAGNOSIS — I340 Nonrheumatic mitral (valve) insufficiency: Secondary | ICD-10-CM | POA: Insufficient documentation

## 2023-09-06 DIAGNOSIS — I350 Nonrheumatic aortic (valve) stenosis: Secondary | ICD-10-CM | POA: Insufficient documentation

## 2023-09-06 NOTE — Progress Notes (Signed)
Please inform her that the test results from the echocardiogram showed normal pumping function of the heart and mildly increased stiffness of the heart muscle which is age-appropriate.  There is evidence for mild leakage across one of the heart valves, mitral valve [on the left side of the heart between the top and bottom chambers of the heart].  This is not expected to cause any significant symptoms and we will continue to monitor this with repeat echocardiogram in future.  There is also evidence of early stages of narrowing of one of the heart valves, called aortic valve on my review of images.  Currently this does not appear to be causing any significant stenosis.  Please forward the results and this note to her PCP. Recommend to schedule you for repeat echocardiogram in 18 months  and a follow-up with me in the office.  Thank you

## 2023-09-06 NOTE — Telephone Encounter (Signed)
Patient returned RN's call regarding test results.

## 2023-09-06 NOTE — Telephone Encounter (Signed)
 Results reviewed with pt as per Dr. Madireddy's note.  Pt verbalized understanding and had no additional questions. Routed to PCP

## 2023-10-01 ENCOUNTER — Ambulatory Visit: Payer: 59 | Admitting: Internal Medicine

## 2023-10-01 ENCOUNTER — Encounter: Payer: Self-pay | Admitting: Internal Medicine

## 2023-10-01 VITALS — BP 140/90 | HR 76 | Temp 97.3°F | Resp 18 | Ht 62.0 in | Wt 198.5 lb

## 2023-10-01 DIAGNOSIS — J449 Chronic obstructive pulmonary disease, unspecified: Secondary | ICD-10-CM

## 2023-10-01 DIAGNOSIS — I700 Atherosclerosis of aorta: Secondary | ICD-10-CM | POA: Diagnosis not present

## 2023-10-01 DIAGNOSIS — E785 Hyperlipidemia, unspecified: Secondary | ICD-10-CM | POA: Diagnosis not present

## 2023-10-01 DIAGNOSIS — M25551 Pain in right hip: Secondary | ICD-10-CM

## 2023-10-01 DIAGNOSIS — E559 Vitamin D deficiency, unspecified: Secondary | ICD-10-CM | POA: Insufficient documentation

## 2023-10-01 DIAGNOSIS — E039 Hypothyroidism, unspecified: Secondary | ICD-10-CM | POA: Diagnosis not present

## 2023-10-01 MED ORDER — ROSUVASTATIN CALCIUM 10 MG PO TABS: 10.0000 mg | ORAL_TABLET | Freq: Every day | ORAL | 6 refills | Status: AC

## 2023-10-01 NOTE — Progress Notes (Signed)
 Office Visit  Subjective   Patient ID: Stacey Poole   DOB: [birth year]   Age: 73 y.o.   MRN: [id number]   Chief Complaint Chief Complaint  Patient presents with   Follow-up    3 month follow up     History of Present Illness 73 years old female who is here for follow up. She was seen by Dr. Luster Salters who suggested that she does not have hernia and she need to see orthopedic for right hip pain. She says when she is sitting she is ok but when she walk or light her right leg up like sitting in a car that when she get really pain. She says that this is different pain from her back pain.   She says that her back pain is better. S/p back surgery. She has lower back pain with radiculopathy and had back surgery 10/2021. That pain was better but pain came back but this pain is different.    She has COPD is better. She does not use albuterol  for her breathing issues. She does not smoke.    She also has hypothyroidism and take levothyroxine  125 mcg daily. Her TSH was 54 in November 24. She is due for repeat TSH today.   She has borderline diabetes and HbA1c was 5.9%. She has hyperlipidemia and She is watching her diet.  Her vitamin D  level was very low and she is taking replacement.   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   Respiratory: Negative.    Cardiovascular: Negative.   Musculoskeletal:  Positive for joint pain.  Neurological: Negative.        Objective:    Vitals BP (!) 140/90 (BP Location: Left Arm, Patient Position: Sitting, Cuff Size: Normal)   Pulse 76   Temp (!) 97.3 F (36.3 C)   Resp 18   Ht 5\' 2"  (1.575 m)   Wt 198 lb 8 oz (90 kg)   SpO2 99%   BMI 36.31 kg/m    Physical Examination Physical Exam Constitutional:      Appearance: Normal appearance. She is obese.  HENT:     Head: Normocephalic and atraumatic.  Cardiovascular:     Rate and Rhythm: Normal rate and regular rhythm.     Heart sounds: Normal heart sounds.  Pulmonary:     Effort: Pulmonary effort is normal.     Breath sounds: Normal breath sounds.  Abdominal:     General: Bowel sounds are normal.     Palpations: Abdomen is soft.  Neurological:     General: No focal deficit present.     Mental Status: She is alert and oriented to person, place, and time.        Assessment & Plan:   Aortic atherosclerosis (HCC) Her LDL was 150 so  I will start her on rosuvastatin  10 mg daily.   COPD (chronic obstructive pulmonary disease) (HCC) Better.  Hypothyroidism (acquired) I will repeat TSH today.  Pain of right hip She will see orthopedic and will continue with celebrex  as needed  Vitamin D  deficiency She will continue with replacement.   Dyslipidemia I will start her  on rosuvastatin  10 mg daily. She will call if she has any side effects.     Return in about 3 months (around 12/29/2023).   Tita Form, MD

## 2023-10-01 NOTE — Assessment & Plan Note (Signed)
 I will start her  on rosuvastatin  10 mg daily. She will call if she has any side effects.

## 2023-10-01 NOTE — Assessment & Plan Note (Signed)
 Better

## 2023-10-01 NOTE — Assessment & Plan Note (Signed)
 She will see orthopedic and will continue with celebrex  as needed

## 2023-10-01 NOTE — Assessment & Plan Note (Signed)
I will repeat TSH today.

## 2023-10-01 NOTE — Assessment & Plan Note (Signed)
 She will continue with replacement.

## 2023-10-01 NOTE — Assessment & Plan Note (Signed)
 Her LDL was 150 so I will start her on rosuvastatin  10 mg daily.

## 2023-10-02 LAB — TSH: TSH: 6.67 u[IU]/mL — ABNORMAL HIGH (ref 0.450–4.500)

## 2023-10-03 ENCOUNTER — Other Ambulatory Visit: Payer: Self-pay

## 2023-10-03 DIAGNOSIS — M810 Age-related osteoporosis without current pathological fracture: Secondary | ICD-10-CM | POA: Diagnosis not present

## 2023-10-03 DIAGNOSIS — Z1231 Encounter for screening mammogram for malignant neoplasm of breast: Secondary | ICD-10-CM | POA: Diagnosis not present

## 2023-10-03 NOTE — Progress Notes (Signed)
Patient called.  Patient aware. Please tell her that her thyroid function is much better but will increase the dose of levothyroxine to 137 mcg daily and repeat TSH in 6 weeks.

## 2023-10-04 ENCOUNTER — Encounter: Payer: Self-pay | Admitting: Internal Medicine

## 2023-10-05 DIAGNOSIS — M810 Age-related osteoporosis without current pathological fracture: Secondary | ICD-10-CM | POA: Diagnosis not present

## 2023-10-08 ENCOUNTER — Other Ambulatory Visit: Payer: Self-pay

## 2023-10-08 ENCOUNTER — Other Ambulatory Visit: Payer: Self-pay | Admitting: Internal Medicine

## 2023-10-08 MED ORDER — LEVOTHYROXINE SODIUM 137 MCG PO TABS: 137.0000 ug | ORAL_TABLET | Freq: Every day | ORAL | 5 refills | Status: AC

## 2023-10-30 ENCOUNTER — Encounter: Payer: Self-pay | Admitting: Internal Medicine

## 2023-11-02 ENCOUNTER — Other Ambulatory Visit: Payer: Self-pay | Admitting: Internal Medicine

## 2023-11-07 DIAGNOSIS — M25551 Pain in right hip: Secondary | ICD-10-CM | POA: Diagnosis not present

## 2023-11-07 DIAGNOSIS — M51369 Other intervertebral disc degeneration, lumbar region without mention of lumbar back pain or lower extremity pain: Secondary | ICD-10-CM | POA: Diagnosis not present

## 2023-12-24 ENCOUNTER — Encounter: Payer: Self-pay | Admitting: Internal Medicine

## 2023-12-24 ENCOUNTER — Ambulatory Visit: Payer: 59 | Admitting: Internal Medicine

## 2023-12-24 VITALS — BP 148/84 | HR 83 | Temp 98.0°F | Resp 18 | Ht 63.0 in | Wt 196.5 lb

## 2023-12-24 DIAGNOSIS — F4321 Adjustment disorder with depressed mood: Secondary | ICD-10-CM | POA: Diagnosis not present

## 2023-12-24 MED ORDER — CLONAZEPAM 0.5 MG PO TABS: 0.5000 mg | ORAL_TABLET | Freq: Two times a day (BID) | ORAL | 0 refills | Status: AC | PRN

## 2023-12-24 NOTE — Assessment & Plan Note (Signed)
 I will start her on clonazepam 0.5 mg one or twice a day as needed.  I will re-evaluate next visit.

## 2023-12-24 NOTE — Progress Notes (Signed)
   Acute Office Visit  Subjective:     Patient ID: Stacey Poole, female    DOB: February 11, 1951, 72 y.o.   MRN: [id number]  Chief Complaint  Patient presents with   Follow-up    3 month follow up    HPI Patient is in today for excessive grieving as her sister died  On 2023-12-13. Her other sister who was at skilled nursing facility will need dialysis otherwise she cannot survive that either.  She says the death of her sister is devastating for her and her mother who is 87 years old.  She is asking for something to help her calmed down.  He does not have any suicidal ideation. her blood pressure is high because of that.  Review of Systems  Constitutional: Negative.   Respiratory: Negative.    Cardiovascular: Negative.   Psychiatric/Behavioral:  The patient is nervous/anxious.         Objective:    BP (!) 148/84   Pulse 83   Temp 98 F (36.7 C)   Resp 18   Ht 5\' 3"  (1.6 m)   Wt 196 lb 8 oz (89.1 kg)   SpO2 97%   BMI 34.81 kg/m    Physical Exam Constitutional:      Appearance: Normal appearance. She is obese.  Cardiovascular:     Rate and Rhythm: Normal rate and regular rhythm.     Heart sounds: Normal heart sounds.  Pulmonary:     Effort: Pulmonary effort is normal.     Breath sounds: Normal breath sounds.  Neurological:     Mental Status: She is alert.     No results found for any visits on 12/24/23.      Assessment & Plan:   Problem List Items Addressed This Visit       Other   Dysfunctional grieving - Primary    Meds ordered this encounter  Medications   clonazePAM (KLONOPIN) 0.5 MG tablet    Sig: Take 1 tablet (0.5 mg total) by mouth 2 (two) times daily as needed for anxiety.    Dispense:  30 tablet    Refill:  0    Return in about 2 weeks (around 01/07/2024).  Tita Form, MD

## 2024-01-04 ENCOUNTER — Ambulatory Visit: Admitting: Internal Medicine

## 2024-01-04 ENCOUNTER — Other Ambulatory Visit: Payer: Self-pay | Admitting: Internal Medicine

## 2024-01-04 VITALS — HR 88 | Temp 97.2°F | Resp 18

## 2024-01-04 DIAGNOSIS — R0981 Nasal congestion: Secondary | ICD-10-CM | POA: Diagnosis not present

## 2024-01-04 DIAGNOSIS — U071 COVID-19: Secondary | ICD-10-CM | POA: Diagnosis not present

## 2024-01-04 LAB — POC COVID19 BINAXNOW: SARS Coronavirus 2 Ag: POSITIVE — AB

## 2024-01-04 MED ORDER — PAXLOVID (300/100) 20 X 150 MG & 10 X 100MG PO TBPK: 3.0000 | ORAL_TABLET | Freq: Two times a day (BID) | ORAL | 0 refills | Status: AC

## 2024-01-04 MED ORDER — GABAPENTIN 300 MG PO CAPS: 300.0000 mg | ORAL_CAPSULE | Freq: Two times a day (BID) | ORAL | 2 refills | Status: AC

## 2024-01-04 MED ORDER — PAXLOVID (300/100) 20 X 150 MG & 10 X 100MG PO TBPK: 3.0000 | ORAL_TABLET | Freq: Two times a day (BID) | ORAL | 0 refills | Status: DC

## 2024-01-04 NOTE — Progress Notes (Signed)
   Acute Office Visit  Subjective:     Patient ID: Stacey Poole, female    DOB: 10/16/1950, 73 y.o.   MRN: [id number]  Chief Complaint  Patient presents with   Coughing    Coughing, congestion,Headache.    HPI Patient is in today for   Cough congestion and headache for 4 days.  She says that she has never been that sick that she has been this time.  Other mother has also same problem but she has actually worsened her.  I saw them outside in a car as her COVID test was positive..   No wheezing.  No fever.  She just feels very tired.  Review of Systems  Constitutional:  Positive for malaise/fatigue.  HENT:  Positive for congestion.   Respiratory:  Positive for cough.   Cardiovascular: Negative.   Gastrointestinal: Negative.         Objective:     Pulse 88   Temp (!) 97.2 F (36.2 C)   Resp 18   SpO2 94%    Physical Exam Constitutional:      Appearance: Normal appearance.  HENT:     Head: Normocephalic and atraumatic.  Cardiovascular:     Rate and Rhythm: Normal rate and regular rhythm.     Heart sounds: Normal heart sounds.  Pulmonary:     Effort: Pulmonary effort is normal.     Breath sounds: Normal breath sounds.  Neurological:     Mental Status: She is alert.     Results for orders placed or performed in visit on 01/04/24  POC COVID-19 BinaxNow  Result Value Ref Range   SARS Coronavirus 2 Ag Positive (A) Negative        Assessment & Plan:   Problem List Items Addressed This Visit       Respiratory   Head congestion - Primary   Relevant Orders   POC COVID-19 BinaxNow (Completed)     Other   COVID-19     Her COVID test is positive.  She will stay at home drink plenty of water and I will send back so wait for her.  If her shortness of breath get worse then she will go to the emergency room or call us .       No orders of the defined types were placed in this encounter.   No follow-ups on file.  Tita Form, MD

## 2024-01-07 ENCOUNTER — Ambulatory Visit: Admitting: Internal Medicine

## 2024-01-08 ENCOUNTER — Encounter: Payer: Self-pay | Admitting: Internal Medicine

## 2024-01-08 NOTE — Progress Notes (Signed)
 Patient called and stated that she is coughing up a milky yellow substance that also looks like it may have a little bit of blood in it. She is having pain in between her shoulder blades. She also stated that she thinks she may be developing pneumonia. Her last dose of Paxlovid  is tomorrow. She has been experiencing extreme diarrhea along with the other symptoms. She was advised that if these symptoms continue or get worse, she is to go straight to the nearest emergency room.

## 2024-01-10 ENCOUNTER — Ambulatory Visit: Admitting: Internal Medicine

## 2024-01-10 DIAGNOSIS — J208 Acute bronchitis due to other specified organisms: Secondary | ICD-10-CM | POA: Insufficient documentation

## 2024-01-10 DIAGNOSIS — U071 COVID-19: Secondary | ICD-10-CM

## 2024-01-10 MED ORDER — GUAIFENESIN-CODEINE 100-10 MG/5ML PO SOLN: 10.0000 mL | Freq: Three times a day (TID) | ORAL | 0 refills | Status: AC | PRN

## 2024-01-10 MED ORDER — AZITHROMYCIN 250 MG PO TABS: ORAL_TABLET | ORAL | 0 refills | Status: AC

## 2024-01-10 NOTE — Assessment & Plan Note (Signed)
 Her COVID test is positive.  She will stay at home drink plenty of water and I will send back so wait for her.  If her shortness of breath get worse then she will go to the emergency room or call us .

## 2024-01-10 NOTE — Progress Notes (Signed)
   Acute Office Visit  Subjective:     Patient ID: Stacey Poole, female    DOB: [birth year], 73 y.o.   MRN: [id number]  No chief complaint on file.   HPI   I have spoken with the patient on telephone after I got her message that she is not feeling well.  She was diagnosed with COVID 19 infection on Friday and I have send Paxlovid  for her.  She she says that she is still very congested, has difficulty to bring sputum out and she has a pain between her shoulder blade.  She cough a lot and bring yellow phlegm.  She is worried that she may have pneumonia as well.  No fever or chills.  She is feeling very weak.  Review of Systems  Constitutional: Negative.   HENT:  Positive for congestion.   Respiratory:  Positive for cough and shortness of breath.   Cardiovascular:  Positive for chest pain.  Neurological:  Positive for weakness.        Objective:    There were no vitals taken for this visit.   Physical Exam    This was a telephone visit so no vital signs were taken and no physical exam was done.      Assessment & Plan:   Problem List Items Addressed This Visit       Respiratory   Acute bronchitis due to 2019 novel coronavirus - Primary    Her symptoms are getting worse.  I will start her on Z-Pak and codeine with guaifenesin for cough.  If she is not better then I will do chest x-ray.      Relevant Medications   azithromycin (ZITHROMAX Z-PAK) 250 MG tablet    Meds ordered this encounter  Medications   azithromycin (ZITHROMAX Z-PAK) 250 MG tablet    Sig: Take 2 tablets (500 mg) on  Day 1,  followed by 1 tablet (250 mg) once daily on Days 2 through 5.    Dispense:  6 each    Refill:  0   guaiFENesin-codeine 100-10 MG/5ML syrup    Sig: Take 10 mLs by mouth 3 (three) times daily as needed.    Dispense:  240 mL    Refill:  0    This was a telephone visit.  No follow-ups on file.  Tita Form, MD

## 2024-01-10 NOTE — Assessment & Plan Note (Signed)
 Her symptoms are getting worse.  I will start her on Z-Pak and codeine with guaifenesin for cough.  If she is not better then I will do chest x-ray.

## 2024-01-16 ENCOUNTER — Other Ambulatory Visit: Payer: Self-pay | Admitting: Internal Medicine

## 2024-01-16 MED ORDER — VITAMIN D-3 125 MCG (5000 UT) PO TABS: 1.0000 | ORAL_TABLET | Freq: Every day | ORAL | 3 refills | Status: AC

## 2024-02-01 DIAGNOSIS — Z961 Presence of intraocular lens: Secondary | ICD-10-CM | POA: Diagnosis not present

## 2024-02-29 ENCOUNTER — Other Ambulatory Visit: Payer: Self-pay | Admitting: Internal Medicine

## 2024-03-03 ENCOUNTER — Other Ambulatory Visit: Payer: Self-pay | Admitting: Internal Medicine

## 2024-07-04 ENCOUNTER — Other Ambulatory Visit: Payer: Self-pay | Admitting: Internal Medicine

## 2024-07-04 MED ORDER — CELECOXIB 200 MG PO CAPS: 200.0000 mg | ORAL_CAPSULE | Freq: Every day | ORAL | 0 refills | Status: AC

## 2024-08-20 ENCOUNTER — Ambulatory Visit

## 2024-08-20 VITALS — BP 150/100 | HR 70 | Temp 97.2°F | Resp 18 | Ht 63.0 in | Wt 182.2 lb

## 2024-08-20 DIAGNOSIS — J0100 Acute maxillary sinusitis, unspecified: Secondary | ICD-10-CM | POA: Diagnosis not present

## 2024-08-20 MED ORDER — AMOXICILLIN 875 MG PO TABS: 875.0000 mg | ORAL_TABLET | Freq: Two times a day (BID) | ORAL | 0 refills | Status: AC

## 2024-08-20 MED ORDER — ALBUTEROL SULFATE HFA 108 (90 BASE) MCG/ACT IN AERS: 2.0000 | INHALATION_SPRAY | Freq: Four times a day (QID) | RESPIRATORY_TRACT | 2 refills | Status: AC | PRN

## 2024-08-20 MED ORDER — METHYLPREDNISOLONE SODIUM SUCC 125 MG IJ SOLR: 125.0000 mg | Freq: Once | INTRAMUSCULAR | 0 refills | Status: DC

## 2024-08-20 MED ORDER — METHYLPREDNISOLONE SODIUM SUCC 40 MG IJ SOLR
120.0000 mg | Freq: Once | INTRAMUSCULAR | Status: AC
  Administered 2024-08-20: 120 mg via INTRAMUSCULAR

## 2024-08-20 NOTE — Progress Notes (Signed)
" ° °  Acute Office Visit  Subjective:     Patient ID: Stacey Poole, female    DOB: [birth year], 73 y.o.   MRN: [id number]  Chief Complaint  Patient presents with   cough    Congestion, eye pressure, SHOB    Patient is a 73 year old female. Presents in office today with lingering cough, congestion, sinus pain and headache. The symptoms have been present the past two weeks. The symptoms were improving but she still does not feel 100 percent better. She tested negative for flu and covid in office.     Review of Systems  Constitutional: Negative.   HENT:  Positive for congestion and sinus pain.   Eyes:  Positive for pain.  Respiratory:  Positive for cough and sputum production.   Genitourinary: Negative.   Musculoskeletal: Negative.   Skin: Negative.   Neurological:  Positive for headaches.  Psychiatric/Behavioral: Negative.          Objective:    BP (!) 150/100   Pulse 70   Temp (!) 97.2 F (36.2 C)   Resp 18   Ht 5' 3 (1.6 m)   Wt 182 lb 4 oz (82.7 kg)   SpO2 96%   BMI 32.28 kg/m    Physical Exam Constitutional:      Appearance: Normal appearance.  Cardiovascular:     Rate and Rhythm: Normal rate and regular rhythm.     Pulses: Normal pulses.     Heart sounds: Normal heart sounds.  Pulmonary:     Effort: Pulmonary effort is normal.     Breath sounds: Normal breath sounds.  Abdominal:     General: Abdomen is flat.  Musculoskeletal:        General: Normal range of motion.     Cervical back: Normal range of motion.  Skin:    General: Skin is warm and dry.  Neurological:     General: No focal deficit present.     Mental Status: She is alert and oriented to person, place, and time.  Psychiatric:        Mood and Affect: Mood normal.        Behavior: Behavior normal.        Thought Content: Thought content normal.        Judgment: Judgment normal.     No results found for any visits on 08/20/24.      Assessment & Plan:   Problem List Items Addressed  This Visit       Respiratory   Acute maxillary sinusitis - Primary   Start Amoxicillin 875 mg BID x 7 days. Take Mucinex  1200 mg BID x 7 days. Rest and drink plenty of fluids. Practice good handwashing. See back in office in 2 months or sooner if need arises.      Relevant Medications   amoxicillin (AMOXIL) 875 MG tablet    Meds ordered this encounter  Medications   amoxicillin (AMOXIL) 875 MG tablet    Sig: Take 1 tablet (875 mg total) by mouth 2 (two) times daily.    Dispense:  14 tablet    Refill:  0   albuterol  (VENTOLIN  HFA) 108 (90 Base) MCG/ACT inhaler    Sig: Inhale 2 puffs into the lungs every 6 (six) hours as needed for wheezing or shortness of breath.    Dispense:  8 g    Refill:  2    Return in about 2 months (around 10/18/2024).  Austine Cork, FNP   "

## 2024-08-20 NOTE — Addendum Note (Signed)
 Addended by: Christabella Alvira on: 08/20/2024 05:36 PM   Modules accepted: Orders

## 2024-08-20 NOTE — Addendum Note (Signed)
 Addended by: Ruven Corradi on: 08/20/2024 05:29 PM   Modules accepted: Orders

## 2024-08-20 NOTE — Assessment & Plan Note (Signed)
 Start Amoxicillin 875 mg BID x 7 days. Take Mucinex  1200 mg BID x 7 days. Rest and drink plenty of fluids. Practice good handwashing. See back in office in 2 months or sooner if need arises.

## 2024-08-29 ENCOUNTER — Ambulatory Visit

## 2024-10-17 ENCOUNTER — Ambulatory Visit
